# Patient Record
Sex: Female | Born: 1945 | Smoking: Never smoker
Health system: Southern US, Community
[De-identification: ages and names within clinical notes are randomized; demographics above are authoritative.]

## PROBLEM LIST (undated history)

## (undated) DIAGNOSIS — N183 Chronic kidney disease, stage 3 unspecified: Secondary | ICD-10-CM

## (undated) DIAGNOSIS — T7840XA Allergy, unspecified, initial encounter: Secondary | ICD-10-CM

## (undated) DIAGNOSIS — I10 Essential (primary) hypertension: Secondary | ICD-10-CM

## (undated) DIAGNOSIS — J449 Chronic obstructive pulmonary disease, unspecified: Secondary | ICD-10-CM

## (undated) DIAGNOSIS — F329 Major depressive disorder, single episode, unspecified: Secondary | ICD-10-CM

## (undated) DIAGNOSIS — M1990 Unspecified osteoarthritis, unspecified site: Secondary | ICD-10-CM

## (undated) DIAGNOSIS — I639 Cerebral infarction, unspecified: Secondary | ICD-10-CM

## (undated) DIAGNOSIS — F32A Depression, unspecified: Secondary | ICD-10-CM

## (undated) DIAGNOSIS — E785 Hyperlipidemia, unspecified: Secondary | ICD-10-CM

## (undated) DIAGNOSIS — K219 Gastro-esophageal reflux disease without esophagitis: Secondary | ICD-10-CM

## (undated) HISTORY — DX: Hyperlipidemia, unspecified: E78.5

## (undated) HISTORY — PX: TUBAL LIGATION: SHX77

## (undated) HISTORY — DX: Gastro-esophageal reflux disease without esophagitis: K21.9

## (undated) HISTORY — DX: Major depressive disorder, single episode, unspecified: F32.9

## (undated) HISTORY — DX: Allergy, unspecified, initial encounter: T78.40XA

## (undated) HISTORY — DX: Cerebral infarction, unspecified: I63.9

## (undated) HISTORY — DX: Essential (primary) hypertension: I10

## (undated) HISTORY — DX: Unspecified osteoarthritis, unspecified site: M19.90

## (undated) HISTORY — DX: Chronic obstructive pulmonary disease, unspecified: J44.9

## (undated) HISTORY — DX: Depression, unspecified: F32.A

---

## 2012-12-06 ENCOUNTER — Institutional Professional Consult (permissible substitution): Payer: Self-pay | Admitting: Internal Medicine

## 2012-12-08 ENCOUNTER — Institutional Professional Consult (permissible substitution): Payer: Self-pay | Admitting: Pulmonary Disease

## 2013-01-08 ENCOUNTER — Institutional Professional Consult (permissible substitution): Payer: Self-pay | Admitting: Internal Medicine

## 2013-01-09 ENCOUNTER — Encounter: Payer: Self-pay | Admitting: Internal Medicine

## 2014-02-27 DIAGNOSIS — G451 Carotid artery syndrome (hemispheric): Secondary | ICD-10-CM | POA: Insufficient documentation

## 2014-04-04 ENCOUNTER — Other Ambulatory Visit: Payer: Self-pay

## 2014-04-04 NOTE — Patient Outreach (Signed)
[facility](THN) Care Management  04/04/2014  Jeronimo GreavesGail D Widger 10-May-1945 [id number]   SUBJECTIVE:  [facility]phone [facility].  Discussed and offered [facility] care management services.  Patient verbally agreed to receive services.   Patient states she had a "mild stroke" approximately 6 weeks ago.  States she has follow up with neurologist, Dr. Adella HareApplegate.  Patient reports she received a blood pressure monitor from her insurance company recently.  States she is taking her blood pressure daily but is not recording.  Patient reports she has had several episodes of low blood pressures.  States blood pressures have been as low as 80/66 and 80/54.  Patient reports her doctor is aware and will continue to monitor.  Patient states she becomes light headed with low blood pressures.   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  States she does walk with a limp on her left side due to the arthritis. Patient denies ambulatory aids.   ASSESSMENT:  Patient will benefit from telephonic case management for hypertension and new onset stroke education.    PLAN:  RNCM will follow up with patient in 3 weeks. RNCM will [facility]. Patient will review consent form and [facility].  George InaDavina Alayzia Pavlock RN,BSN,CCM [facility] Telephonic Care Coordinator [phone]

## 2014-04-05 NOTE — Patient Outreach (Signed)
[facility](THN) Care Management  04/05/2014  Jeronimo GreavesGail D Monarch [birth year] [id number]  Outreach letter and consent packet sent to patient.   George InaDavina Gaynell Eggleton RN,BSN,CCM [facility] Telephonic Care Coordinator [phone]

## 2014-04-17 ENCOUNTER — Other Ambulatory Visit: Payer: Self-pay

## 2014-04-17 NOTE — Patient Outreach (Signed)
[facility](THN) Care Management  04/17/2014  Jeronimo GreavesGail D Baumgartner 10/19/1945 [id number]  RNCM contacted patient for information update regarding insurance carrier/identification and emergency contacts.  Patient provided information.  Patient shares that [facility] and mailed back today.   PLAN:  RNCM will contact patient at next scheduled outreach.   George InaDavina Kaimana Lurz RN,BSN,CCM [facility] Telephonic Care Coordinator [phone]

## 2014-04-22 DIAGNOSIS — I10 Essential (primary) hypertension: Secondary | ICD-10-CM

## 2014-04-22 NOTE — Patient Outreach (Signed)
[facility](THN) Care Management  04/22/2014  Kelsey GreavesGail D Copeland 1945-11-28 [id number]   ASSESSMENT;  Patient will benefit from continued disease management with health coach for hypertension/.  PLAN:  RNCM will refer patient to health coach.  Kelsey InaDavina Ladarren Steiner RN,BSN,CCM [facility] Telephonic Care Coordinator [phone]

## 2014-04-24 ENCOUNTER — Other Ambulatory Visit: Payer: Self-pay

## 2014-04-24 NOTE — Patient Outreach (Signed)
[facility](THN) Care Management  04/24/2014  Kelsey GreavesGail D Copeland [birth year] [id number]   Attempted telephone contact.  Message left requesting patient call back.   [facility] HC will attempt to call later today; if unsuccessful will call on 04-26-14.

## 2014-04-25 ENCOUNTER — Ambulatory Visit: Payer: Self-pay

## 2014-04-26 ENCOUNTER — Ambulatory Visit: Payer: Commercial Managed Care - HMO

## 2014-04-26 ENCOUNTER — Telehealth: Payer: Self-pay

## 2014-04-26 NOTE — Patient Outreach (Signed)
[facility](THN) Care Management  04/26/2014  Kelsey GreavesGail D Copeland 16-May-1945 [id number]   Telephonic contact with patient referred to [facility] with diagnosis of stroke.  Patient states she has had two "minor" strokes.  Long history of hypertension and hyperlipidemia.  Health Coach will follow monthly for disease management for the following barriers: *Needs education about low sodium diets. *Improved management of hypertension with regular monitoring.  Tyler Deisonnie Sariah Henkin, RN, MSN [facility] Health Coach [phone] Fax [phone]

## 2014-05-20 ENCOUNTER — Other Ambulatory Visit: Payer: Commercial Managed Care - HMO

## 2014-05-20 NOTE — Patient Outreach (Signed)
[facility](THN) Care Management  05/20/2014  [name] 04/14/1945 [id number]   Health Coach telephonic contact to follow up with patient regarding self-management of her hypertension and adherence to low sodium diet.  Patient had what she describes as a "small stroke" about 3 months ago.  Patient has implemented a low salt diet by using more fresh fruits and vegetables, reading labels, avoiding salty snacks, and using a decreased sodium product when she cooks.  Patient has also kept a daily record of her BP readings since our first contact on 04-26-14, and reports readings within a normal range.  Education provided today on signs/symptoms of stroke, and stroke action plan.    Will follow up within a month.  Tyler Deisonnie Alisandra Son, RN, MSN [facility] Health Coach [phone] Fax [phone]

## 2014-06-21 ENCOUNTER — Ambulatory Visit: Payer: Self-pay

## 2014-06-25 ENCOUNTER — Other Ambulatory Visit: Payer: Self-pay

## 2014-06-25 DIAGNOSIS — I639 Cerebral infarction, unspecified: Secondary | ICD-10-CM

## 2014-06-25 DIAGNOSIS — I10 Essential (primary) hypertension: Secondary | ICD-10-CM

## 2014-06-25 DIAGNOSIS — R682 Dry mouth, unspecified: Secondary | ICD-10-CM

## 2014-06-25 NOTE — Patient Outreach (Signed)
[facility]) Care Management  [facility] Care Manager  06/25/2014   Kelsey Copeland 1945-12-31 [id number]  Follow-up phone call with patient today by RN Health Coach.  Patient states she is doing well, but was unable to demonstrate knowledge of low-sodium diet or stroke plan. Also reports she has not been taking and recording daily blood pressures.  Health Coach reinforced teaching about using log furnished by [facility] to record daily blood pressures, reviewed signs and symptoms of stroke, and discussed low-sodium diet guidelines.  Patient reports new problem with dry mouth.  Instructed re increasing her fluid intake and using alcohol-free mouth wash, and to report problem to PCP on her scheduled visit next week.   Health Coach will continue monthly telephonic contacts for teaching and support.     Current Medications:  Current Outpatient Prescriptions  Medication Sig Dispense Refill  . amLODipine (NORVASC) 10 MG tablet Take 10 mg by mouth daily.    Marland Kitchen aspirin 325 MG tablet Take 325 mg by mouth daily.    Marland Kitchen atorvastatin (LIPITOR) 80 MG tablet Take 80 mg by mouth daily.    Marland Kitchen ezetimibe (ZETIA) 10 MG tablet Take 10 mg by mouth daily.    . metoprolol tartrate (LOPRESSOR) 25 MG tablet Take 25 mg by mouth 2 (two) times daily.    Marland Kitchen omeprazole (PRILOSEC) 40 MG capsule Take 40 mg by mouth daily.    . sertraline (ZOLOFT) 100 MG tablet Take 100 mg by mouth daily.     No current facility-administered medications for this visit.    Functional Status:  In your present state of health, do you have any difficulty performing the following activities: 06/25/2014 06/25/2014  Hearing? N N  Vision? N N  Difficulty concentrating or making decisions? - N  Walking or climbing stairs? - Y  Dressing or bathing? - N  Doing errands, shopping? Y N  Preparing Food and eating ? - N  Using the Toilet? - N  In the past six months, have you accidently leaked urine? - Y  Do you have problems with loss of  bowel control? - N  Managing your Medications? - N  Managing your Finances? - -  Housekeeping or managing your Housekeeping? - N    Fall/Depression Screening: PHQ 2/9 Scores 06/25/2014 04/26/2014  PHQ - 2 Score 0 0   THN CM Care Plan Problem One        Patient Outreach Telephone from 06/25/2014 in [facility]   Care Plan Problem One  Patient needs instruction and support to adher to a low sodium diet.   Care Plan for Problem One  Active   THN Long Term Goal (31-90 days)  Patient will demonstrate knowledge and adherence to a low sodium diet within 90 days.   [facility] Long Term Goal Start Date  04/26/14   Interventions for Problem One Long Term Goal  Reviewed low sodium guidellines.  Requested she review EMMI.   [facility] CM Short Term Goal #1 (0-30 days)  Patient will verbalize understanding of low sodium diet within 30 days.   [facility] CM Short Term Goal #1 Start Date  06/25/14   Interventions for Short Term Goal #1  Reinforced teaching.  Reopened this goal. Patient appeared less sure of dietary guidelines.    [facility] CM Care Plan Problem Two        Patient Outreach Telephone from 06/25/2014 in [facility]   Care Plan Problem Two  Patient will self-manage hypertension.   Care Plan  for Problem Two  Active   Interventions for Problem Two Long Term Goal   -- [Reinforced need to take and record daily BPs.]   THN Long Term Goal Start Date  04/26/14    [facility] CM Care Plan Problem Three        Patient Outreach Telephone from 06/25/2014 in [facility]   Care Plan Problem Three  Knowledge deficit about stroke plan.   [facility] CM Short Term Goal #1 (0-30 days)  -- [Patient will be able to discuss action plan for stroke withi]   Interventions for Short Term Goal #1  Reinforced education of FAST stroke plan.  Patient only able to name 2 s/s of stroke.     Tyler Deis, RN, MSN [facility] Health Coach [phone] Fax [phone]       [facility]) Care  Management  06/25/2014  Kelsey Copeland 01-27-1945 [id number]

## 2014-07-19 ENCOUNTER — Ambulatory Visit: Payer: Commercial Managed Care - HMO

## 2014-07-22 ENCOUNTER — Other Ambulatory Visit: Payer: Self-pay

## 2014-07-22 NOTE — Patient Outreach (Signed)
[facility]) Care Management  [place]  07/22/2014   Kelsey Copeland 1945/04/08 [id number]  Subjective: Patient reports she is anticipating knee replacement surgery.  She has an orthopedic consult scheduled for next month.  She reports taking her medications as ordered, following a low salt diet, and monitoring her blood pressure each day.  Blood pressure readings reported as in the range of 124/63.  Patient also reports she has decreased the amount of meat that she consumes to 1-2 servings a week, but she was unsure of other dietary sources of protein.  Objective: Alert, oriented patient.  Current Medications:  Current Outpatient Prescriptions  Medication Sig Dispense Refill  . amLODipine (NORVASC) 10 MG tablet Take 10 mg by mouth daily.    Marland Kitchen aspirin 325 MG tablet Take 325 mg by mouth daily.    Marland Kitchen atorvastatin (LIPITOR) 80 MG tablet Take 80 mg by mouth daily.    Marland Kitchen ezetimibe (ZETIA) 10 MG tablet Take 10 mg by mouth daily.    . metoprolol tartrate (LOPRESSOR) 25 MG tablet Take 25 mg by mouth 2 (two) times daily.    Marland Kitchen omeprazole (PRILOSEC) 40 MG capsule Take 40 mg by mouth daily.    . sertraline (ZOLOFT) 100 MG tablet Take 100 mg by mouth daily.     No current facility-administered medications for this visit.    Functional Status:  In your present state of health, do you have any difficulty performing the following activities: 06/25/2014 06/25/2014  Hearing? N N  Vision? N N  Difficulty concentrating or making decisions? - N  Walking or climbing stairs? - Y  Dressing or bathing? - N  Doing errands, shopping? Y N  Preparing Food and eating ? - N  Using the Toilet? - N  In the past six months, have you accidently leaked urine? - Y  Do you have problems with loss of bowel control? - N  Managing your Medications? - N  Managing your Finances? - -  Housekeeping or managing your Housekeeping? - N    Fall/Depression Screening: PHQ 2/9 Scores 07/22/2014 06/25/2014  04/26/2014  PHQ - 2 Score 0 0 0    Assessment: Patient has met goals for monitoring her blood pressure and for adopting a low sodium diet.  She  needs additional education on a Stroke Action Plan, including being able to name signs/symptoms of a stroke. She also needs to increase intake of protein foods other than meat, since she reports only 1-2 servings of meat per week.  Plan: Provide information on protein rich foods.  Patient is to review Stroke Action Plan previously provided to her so that we can discuss it next month.   Candie Mile, RN, MSN [place] [phone] Fax [phone]   [facility]) Care Management  [facility] Care Manager  07/22/2014   Kelsey Copeland [birth year] [id number]  Subjective:   Objective:   Current Medications:  Current Outpatient Prescriptions  Medication Sig Dispense Refill  . amLODipine (NORVASC) 10 MG tablet Take 10 mg by mouth daily.    Marland Kitchen aspirin 325 MG tablet Take 325 mg by mouth daily.    Marland Kitchen atorvastatin (LIPITOR) 80 MG tablet Take 80 mg by mouth daily.    Marland Kitchen ezetimibe (ZETIA) 10 MG tablet Take 10 mg by mouth daily.    . metoprolol tartrate (LOPRESSOR) 25 MG tablet Take 25 mg by mouth 2 (two) times daily.    Marland Kitchen omeprazole (PRILOSEC) 40 MG capsule Take 40 mg by  mouth daily.    . sertraline (ZOLOFT) 100 MG tablet Take 100 mg by mouth daily.     No current facility-administered medications for this visit.    Functional Status:  In your present state of health, do you have any difficulty performing the following activities: 07/22/2014 06/25/2014  Hearing? N N  Vision? N N  Difficulty concentrating or making decisions? N -  Walking or climbing stairs? Y -  Dressing or bathing? N -  Doing errands, shopping? Tempie Donning  Preparing Food and eating ? N -  Using the Toilet? N -  In the past six months, have you accidently leaked urine? Y -  Do you have problems with loss of bowel control? N -  Managing your Medications? N -   Managing your Finances? N -  Housekeeping or managing your Housekeeping? N -    Fall/Depression Screening: PHQ 2/9 Scores 07/22/2014 07/22/2014 06/25/2014 04/26/2014  PHQ - 2 Score 0 0 0 0    Assessment:   Plan:

## 2014-08-20 ENCOUNTER — Ambulatory Visit: Payer: Self-pay

## 2014-08-20 ENCOUNTER — Other Ambulatory Visit: Payer: Self-pay

## 2014-08-20 DIAGNOSIS — M1710 Unilateral primary osteoarthritis, unspecified knee: Secondary | ICD-10-CM | POA: Insufficient documentation

## 2014-08-20 NOTE — Patient Outreach (Signed)
[facility]) Care Management  [facility] Care Manager  08/20/2014   Kelsey Copeland [birth year] [id number]   Telephonic assessment conducted today.  Patient reports success with implementing a low sodium diet, and monitoring daily blood pressure readings.  Her self-reported blood pressure readings are within a normal range.  Weight currently is 160 pounds, which is a 5 pound loss since last month. She saw an orthopedist on 08-15-14, who has decided against knee replacement surgery.  She has a return appointment scheduled for a steroid injection.  She had previously reported eating meat only 1-2 times a week.  Today she was able to name 5 high protein foods that she has added into her diet to maintain a healthy protein intake.    Current Medications:  Current Outpatient Prescriptions  Medication Sig Dispense Refill  . amLODipine (NORVASC) 10 MG tablet Take 10 mg by mouth daily.    Marland Kitchen aspirin 81 MG tablet Take 81 mg by mouth daily.    Marland Kitchen atorvastatin (LIPITOR) 80 MG tablet Take 80 mg by mouth daily.    Marland Kitchen ezetimibe (ZETIA) 10 MG tablet Take 10 mg by mouth daily.    . metoprolol tartrate (LOPRESSOR) 25 MG tablet Take 25 mg by mouth 2 (two) times daily.    Marland Kitchen omeprazole (PRILOSEC) 40 MG capsule Take 40 mg by mouth daily.    Marland Kitchen oxybutynin (DITROPAN) 5 MG tablet Take 5 mg by mouth 2 (two) times daily.    . sertraline (ZOLOFT) 100 MG tablet Take 100 mg by mouth daily.    Marland Kitchen aspirin 325 MG tablet Take 325 mg by mouth daily.     No current facility-administered medications for this visit.    Functional Status:  In your present state of health, do you have any difficulty performing the following activities: 08/20/2014 07/22/2014  Hearing? N N  Vision? N N  Difficulty concentrating or making decisions? N N  Walking or climbing stairs? N Y  Dressing or bathing? N N  Doing errands, shopping? Kelsey Copeland  Preparing Food and eating ? N N  Using the Toilet? N N  In the past six months, have you  accidently leaked urine? Y Y  Do you have problems with loss of bowel control? N N  Managing your Medications? N N  Managing your Finances? N N  Housekeeping or managing your Housekeeping? N N    Fall/Depression Screening: PHQ 2/9 Scores 08/20/2014 07/22/2014 07/22/2014 06/25/2014 04/26/2014  PHQ - 2 Score 0 0 0 0 0    Assessment: Patient unable to fully discuss signs and symptoms of a stroke or articulate an action plan.  Plan: Encouraged patient to continue with the progress she has made with decreasing salt in her diet, and           implementing a daily blood pressure check.           Patient is to review the written educational materials on strokes previously provided to her before our           next telephone contact in approximately one month.  Tyler Deis, RN, MSN RN [employer] [phone] Fax [phone]

## 2014-09-23 ENCOUNTER — Other Ambulatory Visit: Payer: Self-pay

## 2014-09-23 ENCOUNTER — Ambulatory Visit: Payer: Self-pay

## 2014-09-23 DIAGNOSIS — I5042 Chronic combined systolic (congestive) and diastolic (congestive) heart failure: Secondary | ICD-10-CM

## 2014-09-23 NOTE — Patient Outreach (Addendum)
[facility]) Care Management  09/23/2014  Kelsey Copeland [birth year] [id number]   [facility]) Care Management  09/23/2014  Kelsey Copeland 28-Feb-1945 [id number]   Telephone call for scheduled assessment.  Patient reports she has been in the hospital in [place] due to a mild stroke, and was discharged about 3 weeks ago.  States she has had a nurse and PT coming out to visit, but she is unsure if they are coming back.  Patient was unable to answer assessment questions, and did not seem to be herself.  Patient lives alone, but consents signed for [facility] allow for communication with her son and daughter-in-law, who both share POA.  Telephone call placed by RN Health Coach to Kelsey Copeland, daughter-in-law and POA.  Kelsey Copeland reports concerns that patient is becoming confused and struggling to care for herself.  States she has not been bathing, has not eating a healthy diet,  forgets to take her dog out, and is having difficulty managing her finances.  Kelsey Copeland did confirm that Kelsey Copeland was admitted to the hospital in [place] on August 20th and was discharged on the 23rd.  Health Coach will refer to [facility] with request that home visit be coordinated with either her son Kelsey Copeland or daughter-in-law Kelsey Copeland.  Plan: Refer for Transition of Care for home visit assessment.          Notify PCP of patient status.          Daughter-in-law will contact PCP for appointment.  Kelsey Deis, RN, MSN RN [employer] [phone] Fax [phone]

## 2014-09-25 NOTE — Addendum Note (Signed)
Addended by: Matthew Saras on: 09/25/2014 04:24 PM   Modules accepted: Orders

## 2014-09-25 NOTE — Patient Outreach (Signed)
[facility]) Care Management  09/25/2014  Kelsey Copeland [birth year] [id number]   Referral from Tyler Deis, RN to assign Community RN, assigned Egbert Garibaldi, RN (for [name], RN).  Thanks, [name]. Sharlee Blew [facility] Care Management [facility] CM Assistant Phone: [phone] Fax: [phone]

## 2014-09-27 ENCOUNTER — Other Ambulatory Visit: Payer: Self-pay | Admitting: *Deleted

## 2014-09-27 NOTE — Patient Outreach (Signed)
[facility]) Care Management  09/27/2014  DILYNN MUNROE [birth year] [id number]   Telephone outreach call to Mrs.Abreu's home, no answer,phone not in service.Due to concern noted by Health coach on 9/19, I placed a called to her daughter in law, S[place]hire T[place]art [place], she reports that Mrs.Mossberg's cell phone is off and will not being turned by on until Thursday and that would be the best time to call her. Dois Davenport states that Mrs.Bagshaw had a post hospital office visit on this week at Dr. Adah Perl states that Mrs.Pickerel was discharged from [facility] on August 23, she had home health physical therapy for a while but those services have been completed. Patient lives alone in a apartment and uses a walker for ambulation . Dois Davenport states that she provides transportation for Mrs. Willden and assist with grocery shopping and Mrs. Aller manages her own medication. Dois Davenport voiced concern regarding Mrs Manrique being forgetful.  Plan: Care management coordinator  will follow up with Mrs. Beem next to assess needs.  Egbert Garibaldi, RN, [facility] Care Management [phone]

## 2014-09-30 NOTE — Patient Outreach (Signed)
Documentation encounter: New referral received by [facility] last week ( during my absence) Ander Purpura RN called patient and health care POA. It was suggested to call patient on 10/03/2014. I reviewed EPIC and [facility] Electronic Medical records.  Patient is not able to be located in [facility] medical records. ( Patient is not found).  PLAN: Will perform outreach attempt on 10/03/2014 as requested by health care power of attorney.   [name], RN, BSN, CEN [facility] [employer] [phone]

## 2014-10-03 ENCOUNTER — Other Ambulatory Visit: Payer: Self-pay

## 2014-10-03 NOTE — Patient Outreach (Signed)
Care Coordination: Placed call to patient who identified herself. Patient reports that she is struggling after having her stroke. Would like an in home assessment.  Offered appointment for Monday 10/07/2014. Patient accepted. Confirmed address.  PLAN: Kelsey Copeland see patient for an initial home visit on 10/07/2014.  [name], RN, BSN, CEN [facility] [employer] [phone]

## 2014-10-07 ENCOUNTER — Other Ambulatory Visit: Payer: Self-pay

## 2014-10-07 NOTE — Patient Outreach (Signed)
[facility]) Care Management  10/07/2014  Kelsey Copeland March 29, 1945 [id number] Initial Home Visit: Patient with stroke like symptoms:  11:00 AM  Arrived for scheduled home visit. Patient ambulated to the door however it took her a few minutes to answer the door. I reminded patient the purpose of my visit and patient just stared at me. She was having difficulty talking and processing her thoughts.  I ask her how long she had these symptoms and she reported 2-2.5 hours. I asked patient if I could call son and she said yes. She was unable to verbalize phone number but handed me a book with his number.  11:07am  Placed call to son to explain patients condition. Son reports that this is not normal for patient.  I explained my concerns of patients symptoms of a stroke.  I suggested that we call 911 and son agreed. Patient with slurred speech, right facial drooping and unable to process thoughts.  11:09 am Placed call to EMS. 11:12 Spoke with daughter in law and she reports that patient did not seem like herself this morning on the phone.  Daughter in law ask me to put dog in bedroom and lock the door behind Kelsey Copeland.  I assisted with gathering medications and important papers for patient while EMS was assessing. 11:18 am  EMS arrived 11:25am  Patient en route to [facility]. I informed EMS I would notify family. 11:30 am Attempted to reach son and daughter in law. Unsuccessful. Left a message requesting a call back. 11:40 am  Spoke with daughter in law and provided update of where patient was being transported. 11:59 am  Received call from attending ED physician named Dr. Bernette Mayers at [phone].  Inquiring about history and symptoms. I provided son and daughters contact number and described my finding when I arrived for home visit.  PLAN: Will follow up with daughter in law and son in the next 2 days. Will reschedule home visit when patient is ready.  Spoke with Melissa at Dr.  Lamar Sprinkles office and informed her of events   Please note initial home visit not completed due patients acute symptoms.  [name], RN, BSN, CEN [facility] [employer] [phone] .

## 2014-10-10 ENCOUNTER — Other Ambulatory Visit: Payer: Self-pay

## 2014-10-10 NOTE — Patient Outreach (Signed)
Care Coordination.  Follow up for acute stroke.  Placed call to daughter in law Dois Davenport. She reports that patient remains inpatient at [facility] with acute stroke. Family voiced that patient is unable to speak. Reports that they filled out paperwork for medicaid and are working with social worker to determine rehab and placement options. At this time, family does not yet have a solid plan. Encouraged daughter in law and son to speak openly with social worker at the hospital about concerns they have with patient returning home related to safety concerns, inability for patient to care for herself including not taking meds and not eating well.   Plan: Discussed with daughter in law Dois Davenport that I would call her in a few weeks to get a update of plan. Provided emotional support and my contact number during telephone call.  [name], RN, BSN, CEN [facility] [phone]

## 2014-10-22 ENCOUNTER — Other Ambulatory Visit: Payer: Self-pay

## 2014-10-22 NOTE — Patient Outreach (Signed)
Phone call from daughter in law: Daughter in law called and states that patient is no longer able to care for herself at home. Reports that she has spoken with social worker at [facility].  Reports that she was told that patient needed another discharge plan, Daughter in law reports that she and her husband are unable to care for patient and she woul d need placement. I encouraged daughter in law to work with hospital staff / [facility]istsocial worker [facility] to get [facility]. Daughter in law informs he that they have already cleaned out her apartment and provided a notice so patient would not have rent for next month and her check could go to whatever facility patient is placed in.  Plan: Daughter in law will continue to call me with update. I provided a listening ear and helpful suggestions during this call top help daughter in law process crisis.  Rowe PavyAmanda Cook, RN, BSN, CEN [facility] [employer] Care Coordinator [phone]

## 2014-10-29 ENCOUNTER — Other Ambulatory Visit: Payer: Self-pay

## 2014-10-29 NOTE — Patient Outreach (Signed)
Care Coordination: Follow up call made to son and daughter in law from last week. Daughter in law reports that patient was discharged from [facility]. Daughter in law reports that reviews from facility were not good and that family decided to take patient home with them. ( Son and daughter in law) on 10/24/2014 Daughter in law reports that patient is doing well at their home. Reports she does not need [facility] assistance at this time. Reports that patient saw primary MD today and will have speech therapy and will be getting a wheelchair. Daughter in law reports that she is managing medications, meals and all needs without difficulty.  I offered to come to do a home visit and daughter in law declined. Reports that she will call me if patient has in needs in the future.   I will send a case closure letter to MD and will notify Nena PolioLisa Moore of case closure.  Rowe PavyAmanda Dawanna Grauberger, RN, BSN, CEN [facility] [employer] Care Coordinator [phone]

## 2014-11-01 NOTE — Patient Outreach (Signed)
[facility]) Care Management  11/01/2014  Jeronimo GreavesGail D Donlan [birth year] [id number]   Notification from Rowe PavyAmanda Cook, RN patient and caregivers wished to withdraw from [facility] services.  Thanks, Corrie MckusickLisa O. Sharlee BlewMoore, AABA [facility] Care Management [facility] CM Assistant Phone: [phone] Fax: [phone]

## 2014-12-31 ENCOUNTER — Inpatient Hospital Stay (HOSPITAL_COMMUNITY)
Admission: EM | Admit: 2014-12-31 | Discharge: 2015-01-03 | DRG: 041 | Disposition: A | Discharge status: Skilled Nursing Facility | Payer: Commercial Managed Care - HMO | Attending: Internal Medicine | Admitting: Internal Medicine

## 2014-12-31 ENCOUNTER — Emergency Department (HOSPITAL_COMMUNITY): Payer: Commercial Managed Care - HMO

## 2014-12-31 ENCOUNTER — Encounter (HOSPITAL_COMMUNITY): Payer: Self-pay

## 2014-12-31 DIAGNOSIS — I63432 Cerebral infarction due to embolism of left posterior cerebral artery: Secondary | ICD-10-CM | POA: Diagnosis not present

## 2014-12-31 DIAGNOSIS — I129 Hypertensive chronic kidney disease with stage 1 through stage 4 chronic kidney disease, or unspecified chronic kidney disease: Secondary | ICD-10-CM | POA: Diagnosis present

## 2014-12-31 DIAGNOSIS — R609 Edema, unspecified: Secondary | ICD-10-CM

## 2014-12-31 DIAGNOSIS — I638 Other cerebral infarction: Secondary | ICD-10-CM | POA: Diagnosis not present

## 2014-12-31 DIAGNOSIS — K219 Gastro-esophageal reflux disease without esophagitis: Secondary | ICD-10-CM | POA: Diagnosis present

## 2014-12-31 DIAGNOSIS — G40909 Epilepsy, unspecified, not intractable, without status epilepticus: Secondary | ICD-10-CM | POA: Diagnosis present

## 2014-12-31 DIAGNOSIS — G9340 Encephalopathy, unspecified: Secondary | ICD-10-CM | POA: Diagnosis not present

## 2014-12-31 DIAGNOSIS — I639 Cerebral infarction, unspecified: Secondary | ICD-10-CM | POA: Diagnosis present

## 2014-12-31 DIAGNOSIS — M1710 Unilateral primary osteoarthritis, unspecified knee: Secondary | ICD-10-CM | POA: Diagnosis present

## 2014-12-31 DIAGNOSIS — Z7982 Long term (current) use of aspirin: Secondary | ICD-10-CM | POA: Diagnosis not present

## 2014-12-31 DIAGNOSIS — F329 Major depressive disorder, single episode, unspecified: Secondary | ICD-10-CM | POA: Diagnosis present

## 2014-12-31 DIAGNOSIS — M1990 Unspecified osteoarthritis, unspecified site: Secondary | ICD-10-CM | POA: Diagnosis present

## 2014-12-31 DIAGNOSIS — R4781 Slurred speech: Secondary | ICD-10-CM | POA: Diagnosis present

## 2014-12-31 DIAGNOSIS — N183 Chronic kidney disease, stage 3 unspecified: Secondary | ICD-10-CM

## 2014-12-31 DIAGNOSIS — I69351 Hemiplegia and hemiparesis following cerebral infarction affecting right dominant side: Secondary | ICD-10-CM | POA: Diagnosis not present

## 2014-12-31 DIAGNOSIS — R29810 Facial weakness: Secondary | ICD-10-CM | POA: Diagnosis present

## 2014-12-31 DIAGNOSIS — J449 Chronic obstructive pulmonary disease, unspecified: Secondary | ICD-10-CM | POA: Insufficient documentation

## 2014-12-31 DIAGNOSIS — I10 Essential (primary) hypertension: Secondary | ICD-10-CM | POA: Diagnosis present

## 2014-12-31 DIAGNOSIS — G510 Bell's palsy: Secondary | ICD-10-CM

## 2014-12-31 DIAGNOSIS — R4701 Aphasia: Secondary | ICD-10-CM | POA: Diagnosis present

## 2014-12-31 DIAGNOSIS — R569 Unspecified convulsions: Secondary | ICD-10-CM | POA: Insufficient documentation

## 2014-12-31 DIAGNOSIS — E785 Hyperlipidemia, unspecified: Secondary | ICD-10-CM | POA: Diagnosis present

## 2014-12-31 HISTORY — DX: Chronic kidney disease, stage 3 unspecified: N18.30

## 2014-12-31 HISTORY — DX: Chronic kidney disease, stage 3 (moderate): N18.3

## 2014-12-31 LAB — DIFFERENTIAL
BASOS ABS: 0 10*3/uL (ref 0.0–0.1)
BASOS PCT: 0 %
Eosinophils Absolute: 0.1 10*3/uL (ref 0.0–0.7)
Eosinophils Relative: 2 %
LYMPHS PCT: 17 %
Lymphs Abs: 1.3 10*3/uL (ref 0.7–4.0)
Monocytes Absolute: 0.4 10*3/uL (ref 0.1–1.0)
Monocytes Relative: 6 %
NEUTROS ABS: 5.5 10*3/uL (ref 1.7–7.7)
NEUTROS PCT: 75 %

## 2014-12-31 LAB — I-STAT CHEM 8, ED
BUN: 14 mg/dL (ref 6–20)
CALCIUM ION: 1.13 mmol/L (ref 1.13–1.30)
CHLORIDE: 111 mmol/L (ref 101–111)
Creatinine, Ser: 1.2 mg/dL — ABNORMAL HIGH (ref 0.44–1.00)
Glucose, Bld: 95 mg/dL (ref 65–99)
HEMATOCRIT: 34 % — AB (ref 36.0–46.0)
Hemoglobin: 11.6 g/dL — ABNORMAL LOW (ref 12.0–15.0)
POTASSIUM: 3.8 mmol/L (ref 3.5–5.1)
SODIUM: 142 mmol/L (ref 135–145)
TCO2: 18 mmol/L (ref 0–100)

## 2014-12-31 LAB — COMPREHENSIVE METABOLIC PANEL
ALBUMIN: 3.5 g/dL (ref 3.5–5.0)
ALK PHOS: 81 U/L (ref 38–126)
ALT: 21 U/L (ref 14–54)
AST: 30 U/L (ref 15–41)
Anion gap: 12 (ref 5–15)
BUN: 13 mg/dL (ref 6–20)
CALCIUM: 9.4 mg/dL (ref 8.9–10.3)
CHLORIDE: 110 mmol/L (ref 101–111)
CO2: 20 mmol/L — AB (ref 22–32)
CREATININE: 1.46 mg/dL — AB (ref 0.44–1.00)
GFR calc non Af Amer: 36 mL/min — ABNORMAL LOW (ref >60)
GFR, EST AFRICAN AMERICAN: 41 mL/min — AB (ref >60)
GLUCOSE: 100 mg/dL — AB (ref 65–99)
Potassium: 3.9 mmol/L (ref 3.5–5.1)
SODIUM: 142 mmol/L (ref 135–145)
Total Bilirubin: 0.6 mg/dL (ref 0.3–1.2)
Total Protein: 6.4 g/dL — ABNORMAL LOW (ref 6.5–8.1)

## 2014-12-31 LAB — CBC
HCT: 33.4 % — ABNORMAL LOW (ref 36.0–46.0)
Hemoglobin: 11.1 g/dL — ABNORMAL LOW (ref 12.0–15.0)
MCH: 29.2 pg (ref 26.0–34.0)
MCHC: 33.2 g/dL (ref 30.0–36.0)
MCV: 87.9 fL (ref 78.0–100.0)
PLATELETS: 219 10*3/uL (ref 150–400)
RBC: 3.8 MIL/uL — AB (ref 3.87–5.11)
RDW: 15.1 % (ref 11.5–15.5)
WBC: 7.3 10*3/uL (ref 4.0–10.5)

## 2014-12-31 LAB — PROTIME-INR
INR: 1.06 (ref 0.00–1.49)
PROTHROMBIN TIME: 14 s (ref 11.6–15.2)

## 2014-12-31 LAB — I-STAT TROPONIN, ED: Troponin i, poc: 0.01 ng/mL (ref 0.00–0.08)

## 2014-12-31 LAB — ETHANOL

## 2014-12-31 LAB — APTT: APTT: 26 s (ref 24–37)

## 2014-12-31 MED ORDER — SODIUM CHLORIDE 0.9 % IV SOLN
INTRAVENOUS | Status: DC
  Administered 2015-01-01: 1000 mL via INTRAVENOUS
  Administered 2015-01-01: 02:00:00 via INTRAVENOUS

## 2014-12-31 MED ORDER — TRAZODONE HCL 50 MG PO TABS
50.0000 mg | ORAL_TABLET | Freq: Every day | ORAL | Status: DC
  Administered 2015-01-01 – 2015-01-02 (×2): 50 mg via ORAL
  Filled 2014-12-31 (×3): qty 1

## 2014-12-31 MED ORDER — ATORVASTATIN CALCIUM 80 MG PO TABS
80.0000 mg | ORAL_TABLET | Freq: Every day | ORAL | Status: DC
  Administered 2015-01-01 – 2015-01-03 (×3): 80 mg via ORAL
  Filled 2014-12-31 (×4): qty 1

## 2014-12-31 MED ORDER — AMLODIPINE BESYLATE 10 MG PO TABS
10.0000 mg | ORAL_TABLET | Freq: Every day | ORAL | Status: DC
  Administered 2015-01-01 – 2015-01-02 (×2): 10 mg via ORAL
  Filled 2014-12-31 (×3): qty 1

## 2014-12-31 MED ORDER — METOPROLOL TARTRATE 25 MG PO TABS: 25.0000 mg | ORAL_TABLET | Freq: Every day | ORAL | Status: DC

## 2014-12-31 MED ORDER — PANTOPRAZOLE SODIUM 40 MG PO TBEC
40.0000 mg | DELAYED_RELEASE_TABLET | Freq: Every day | ORAL | Status: DC
  Administered 2015-01-01 – 2015-01-02 (×2): 40 mg via ORAL
  Filled 2014-12-31 (×3): qty 1

## 2014-12-31 MED ORDER — ASPIRIN 325 MG PO TABS
325.0000 mg | ORAL_TABLET | Freq: Every day | ORAL | Status: DC
  Administered 2015-01-01 – 2015-01-02 (×2): 325 mg via ORAL
  Filled 2014-12-31 (×3): qty 1

## 2014-12-31 MED ORDER — EZETIMIBE 10 MG PO TABS
10.0000 mg | ORAL_TABLET | Freq: Every day | ORAL | Status: DC
  Administered 2015-01-01 – 2015-01-02 (×2): 10 mg via ORAL
  Filled 2014-12-31 (×3): qty 1

## 2014-12-31 MED ORDER — SERTRALINE HCL 100 MG PO TABS
200.0000 mg | ORAL_TABLET | Freq: Every day | ORAL | Status: DC
  Administered 2015-01-01 – 2015-01-02 (×2): 200 mg via ORAL
  Filled 2014-12-31 (×4): qty 2

## 2014-12-31 MED ORDER — FOLIC ACID 1 MG PO TABS
1.0000 mg | ORAL_TABLET | Freq: Every day | ORAL | Status: DC
  Administered 2015-01-01 – 2015-01-02 (×2): 1 mg via ORAL
  Filled 2014-12-31 (×3): qty 1

## 2014-12-31 NOTE — ED Notes (Signed)
[facility] [place] [facility], pt felt like she had trouble speaking, but family did not notice at the time, since [id number] family has noticed increase trouble walking and confusion, aphasia, and slurred speech. During transport slurred speech has resolved, but aphasia remains. No unilateral deficits. Two priors strokes in past six months with prior R handed weakness.

## 2014-12-31 NOTE — ED Provider Notes (Signed)
The patient is a [age] year old female, she [facility], they report that she was last seen normal at a 5:00, they're unsure of these exact numbers, we have been unable to get ahold of family members thus far. The patient states that she has some difficulty speaking, clearly she is aphasic on exam. She has a right-sided facial droop, dysmetria with the right upper extremity and has slight weakness of the right arm and right leg compared to the left side. She has no carotid bruits, abdomen is soft and nontender, no peripheral edema, no murmurs of the heart, clear lungs. CT scan of the head has been ordered and is negative, labs pending, neurology consult pending, we have activated code stroke based on this estimated 5:00 last seen normal.   EKG Interpretation  Date/Time:  Tuesday December 31 2014 21:50:09 EST Ventricular Rate:  87 PR Interval:  138 QRS Duration: 79 QT Interval:  376 QTC Calculation: 452 R Axis:   27 Text Interpretation:  Sinus rhythm Borderline low voltage, extremity leads No old tracing to compare Confirmed by Rogerio Boutelle  MD, Jolan Upchurch ([id number]) on 12/31/2014 10:16:41 PM       Medical screening examination/treatment/procedure(s) were conducted as a shared visit with non-physician practitioner(s) and myself.  I personally evaluated the patient during the encounter.  Clinical Impression:   Final diagnoses:  Acute ischemic stroke [facility](HCC)         Eber HongBrian Robertlee Rogacki, MD 01/04/15 2208

## 2014-12-31 NOTE — ED Notes (Signed)
Admitting at bedside 

## 2014-12-31 NOTE — H&P (Signed)
[facility] History and Physical  Kelsey Copeland ZOX:[id number] DOB: [birth year] DOA: 12/31/2014  Referring physician: ED physician PCP: Abigail Miyamoto, MD  Specialists:   Chief Complaint: worsening aphasia and confusion.  HPI: Kelsey Copeland is a 69 y.o. female with PMH of hypertension, hyperlipidemia, stroke, arthritis, chronic kidney disease-stage III, GERD, depression, who presents with worsening aphasia and confusion.  Per patient's daughter-in-law, patient seems to have worsening slurred speech and confusion started on 17:00 PM. She also has difficult walking with R leg weaker than the left. Her daughter-in law found her on the ground using her left arm to "pick at" things that were around her. She is not sure about dystonic posturing of the right arm as she was standing behind her and did not take notice of it. She had a similar episode of picking at things and not speaking yesterday, but did not have the same confusion afterwards that she has today. Patient does not have vision change or hearing loss. No chest pain, shortness of breath, abdominal pain, diarrhea, symptoms of UTI.  In ED, patient was found to have INR 1.06, RBC 7.3, temperature 98.3, no tachycardia, stable renal function. Negative CT head for acute abnormalities. Patient admitted to inpatient for further eval and treatment. Neurology was consulted.  Where does patient live?   At home   Can patient participate in ADLs? Barely  Review of Systems:   General: no fevers, chills, no changes in body weight, has fatigue HEENT: no blurry vision, hearing changes or sore throat Pulm: no dyspnea, coughing, wheezing CV: no chest pain, palpitations Abd: no nausea, vomiting, abdominal pain, diarrhea, constipation GU: no dysuria, burning on urination, increased urinary frequency, hematuria  Ext: no leg edema Neuro: has leg weakness, no vision change or hearing loss. Has R facial droop. Skin: no rash MSK: No muscle  spasm, no deformity, no limitation of range of movement in spin Heme: No easy bruising.  Travel history: No recent long distant travel.  Allergy:  Allergies  Allergen Reactions  . Plavix [Clopidogrel Bisulfate] Anaphylaxis    Bleeding also  . Amoxicillin Other (See Comments)  . Ciprofloxacin Other (See Comments)    Causes muscle aches.  . Dipyrone Other (See Comments)    headaches  . Acyclovir And Related Other (See Comments)  . Celebrex [Celecoxib] Other (See Comments)  . Penicillins Rash    Past Medical History  Diagnosis Date  . Allergy   . Depression   . GERD (gastroesophageal reflux disease)   . Hyperlipidemia   . Hypertension   . Stroke (HCC)   . Arthritis   . COPD (chronic obstructive pulmonary disease) (HCC)   . CKD (chronic kidney disease), stage III     Past Surgical History  Procedure Laterality Date  . Tubal ligation      Social History:  reports that she has never smoked. She does not have any smokeless tobacco history on file. She reports that she does not drink alcohol or use illicit drugs.  Family History: History reviewed. No pertinent family history.   Prior to Admission medications   Medication Sig Start Date End Date Taking? Authorizing Provider  amLODipine (NORVASC) 10 MG tablet Take 10 mg by mouth daily.   Yes Historical Provider, MD  aspirin 325 MG tablet Take 325 mg by mouth daily.   Yes Historical Provider, MD  atorvastatin (LIPITOR) 80 MG tablet Take 80 mg by mouth daily.   Yes Historical Provider, MD  ezetimibe (ZETIA) 10 MG tablet Take 10  mg by mouth daily.   Yes Historical Provider, MD  folic acid (FOLVITE) 1 MG tablet Take 1 mg by mouth daily.   Yes Historical Provider, MD  metoprolol tartrate (LOPRESSOR) 25 MG tablet Take 25 mg by mouth daily.    Yes Historical Provider, MD  omeprazole (PRILOSEC) 40 MG capsule Take 40 mg by mouth daily.   Yes Historical Provider, MD  sertraline (ZOLOFT) 100 MG tablet Take 200 mg by mouth daily.    Yes  Historical Provider, MD  traZODone (DESYREL) 50 MG tablet Take 50 mg by mouth at bedtime.   Yes Historical Provider, MD    Physical Exam: Filed Vitals:   01/01/15 0000 01/01/15 0143 01/01/15 0200 01/01/15 0400  BP: 106/66 142/81 137/68 108/66  Pulse: 90 91 92 89  Temp:  97.9 F (36.6 C) 97.8 F (36.6 C) 97.8 F (36.6 C)  TempSrc:  Oral Oral Oral  Resp: 20  22 20   Height:  5' (1.524 m)    Weight:  68.221 kg (150 lb 6.4 oz)    SpO2: 93% 98% 94% 98%   General: Not in acute distress HEENT:       Eyes: PERRL, EOMI, no scleral icterus.       ENT: No discharge from the ears and nose, no pharynx injection, no tonsillar enlargement.        Neck: No JVD, no bruit, no mass felt. Heme: No neck lymph node enlargement. Cardiac: S1/S2, RRR, No murmurs, No gallops or rubs. Pulm:  No rales, wheezing, rhonchi or rubs. Abd: Soft, nondistended, nontender, no rebound pain, no organomegaly, BS present. Ext: No pitting leg edema bilaterally. 2+DP/PT pulse bilaterally. Musculoskeletal: No joint deformities, No joint redness or warmth, no limitation of ROM in spin. Skin: No rashes.  Neuro: Alert, oriented X3, cranial nerves II-XII grossly intact except for slurred speech and R facial droop, muscle strength 2/5 in both legs and 5/5 in both arms. Sensation to light touch intact.  Knee reflex 1+ bilaterally. Negative Babinski's sign Psych: Patient is not psychotic, no suicidal or hemocidal ideation.  Labs on Admission:  Basic Metabolic Panel:  Recent Labs Lab 12/31/14 2205 12/31/14 2212  NA 142 142  K 3.8 3.9  CL 111 110  CO2  --  20*  GLUCOSE 95 100*  BUN 14 13  CREATININE 1.20* 1.46*  CALCIUM  --  9.4   Liver Function Tests:  Recent Labs Lab 12/31/14 2212  AST 30  ALT 21  ALKPHOS 81  BILITOT 0.6  PROT 6.4*  ALBUMIN 3.5   No results for input(s): LIPASE, AMYLASE in the last 168 hours. No results for input(s): AMMONIA in the last 168 hours. CBC:  Recent Labs Lab 12/31/14 2205  12/31/14 2212  WBC  --  7.3  NEUTROABS  --  5.5  HGB 11.6* 11.1*  HCT 34.0* 33.4*  MCV  --  87.9  PLT  --  219   Cardiac Enzymes: No results for input(s): CKTOTAL, CKMB, CKMBINDEX, TROPONINI in the last 168 hours.  BNP (last 3 results) No results for input(s): BNP in the last 8760 hours.  ProBNP (last 3 results) No results for input(s): PROBNP in the last 8760 hours.  CBG: No results for input(s): GLUCAP in the last 168 hours.  Radiological Exams on Admission: Ct Head Wo Contrast  12/31/2014  CLINICAL DATA:  69 year old female with difficulty speaking and walking presenting with confusion and slurred speech. EXAM: CT HEAD WITHOUT CONTRAST TECHNIQUE: Contiguous axial images were obtained from  the base of the skull through the vertex without intravenous contrast. COMPARISON:  None. FINDINGS: There is dilatation of the ventricles out of proportion with the sulci which may represent central volume loss versus normal pressure hydrocephalus. Clinical correlation is recommended. Periventricular and deep white matter hypodensities represent chronic microvascular ischemic changes. Small left frontal lobe cortical/subcortical old-appearing infarct and encephalomalacia. There is no intracranial hemorrhage. No mass effect or midline shift identified. The visualized paranasal sinuses and mastoid air cells are well aerated. The calvarium is intact. IMPRESSION: No acute intracranial hemorrhage. 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 If symptoms persist and there are no contraindications, MRI may provide better evaluation if clinically indicated. These results were called by telephone at the time of interpretation on 12/31/2014 at 10:40 pm to Dr. Eber Hong , who verbally acknowledged these results. Electronically Signed   By: Elgie Collard M.D.   On: 12/31/2014 22:47   Mr Brain Wo Contrast  01/01/2015  CLINICAL DATA:  Aphasia. Evaluate  stroke. History of chronic kidney disease, hyperlipidemia and hypertension. EXAM: MRI HEAD WITHOUT CONTRAST MRA HEAD WITHOUT CONTRAST TECHNIQUE: Multiplanar, multiecho pulse sequences of the brain and surrounding structures were obtained without intravenous contrast. Angiographic images of the head were obtained using MRA technique without contrast. COMPARISON:  CT head December 31, 2014 FINDINGS: MRI HEAD FINDINGS Multiple sequences are mildly or moderately motion degraded. Subcentimeter focus of reduced diffusion LEFT occipital lobe cortex. Due to small size, limited assessment on ADC map. Moderate ventriculomegaly on the basis of global parenchymal brain volume loss. Small area LEFT frontal encephalomalacia. Patchy supratentorial and confluent pontine white matter T2 hyperintensities without midline shift, mass effect or mass lesions. No abnormal extra-axial fluid collections. Dural calcifications along anterior falx. Ocular globes and orbital contents are unremarkable though not tailored for evaluation. No abnormal sellar expansion. No suspicious calvarial bone marrow signal. Craniocervical junction maintained. Visualized paranasal sinuses and mastoid air cells are well-aerated. MRA HEAD FINDINGS Moderately motion degraded examination. Anterior circulation: Normal flow related enhancement of the included cervical, petrous, cavernous and supraclinoid internal carotid arteries. Patent anterior communicating artery. Normal flow related enhancement of the anterior and middle cerebral arteries, including distal segments. LEFT A1 segment is developmentally diminutive. Supernumerary anterior cerebral artery arising from from A-comm artery. No large vessel occlusion, high-grade stenosis, definite aneurysm. Posterior circulation: Codominant vertebral arteries. Basilar artery is patent, with normal flow related enhancement of the main branch vessels. Normal flow related enhancement of the posterior cerebral arteries.  Fetal origin LEFT posterior cerebral artery. No large vessel occlusion, high-grade stenosis, definite aneurysm. IMPRESSION: MRI HEAD: Motion degraded examination. Subcentimeter focus of probable acute ischemia LEFT occipital lobe. Moderate to severe chronic small vessel ischemic disease. Moderate global brain atrophy. LEFT frontal lobe encephalomalacia may be posttraumatic or ischemic. MRA HEAD: Moderately motion degraded examination without acute large vessel occlusion or definite high-grade stenosis. Electronically Signed   By: Awilda Metro M.D.   On: 01/01/2015 01:55   Mr Maxine Glenn Head/brain Wo Cm  01/01/2015  CLINICAL DATA:  Aphasia. Evaluate stroke. History of chronic kidney disease, hyperlipidemia and hypertension. EXAM: MRI HEAD WITHOUT CONTRAST MRA HEAD WITHOUT CONTRAST TECHNIQUE: Multiplanar, multiecho pulse sequences of the brain and surrounding structures were obtained without intravenous contrast. Angiographic images of the head were obtained using MRA technique without contrast. COMPARISON:  CT head December 31, 2014 FINDINGS: MRI HEAD FINDINGS Multiple sequences are mildly or moderately motion degraded. Subcentimeter focus of reduced diffusion LEFT occipital lobe cortex. Due to  small size, limited assessment on ADC map. Moderate ventriculomegaly on the basis of global parenchymal brain volume loss. Small area LEFT frontal encephalomalacia. Patchy supratentorial and confluent pontine white matter T2 hyperintensities without midline shift, mass effect or mass lesions. No abnormal extra-axial fluid collections. Dural calcifications along anterior falx. Ocular globes and orbital contents are unremarkable though not tailored for evaluation. No abnormal sellar expansion. No suspicious calvarial bone marrow signal. Craniocervical junction maintained. Visualized paranasal sinuses and mastoid air cells are well-aerated. MRA HEAD FINDINGS Moderately motion degraded examination. Anterior circulation: Normal  flow related enhancement of the included cervical, petrous, cavernous and supraclinoid internal carotid arteries. Patent anterior communicating artery. Normal flow related enhancement of the anterior and middle cerebral arteries, including distal segments. LEFT A1 segment is developmentally diminutive. Supernumerary anterior cerebral artery arising from from A-comm artery. No large vessel occlusion, high-grade stenosis, definite aneurysm. Posterior circulation: Codominant vertebral arteries. Basilar artery is patent, with normal flow related enhancement of the main branch vessels. Normal flow related enhancement of the posterior cerebral arteries. Fetal origin LEFT posterior cerebral artery. No large vessel occlusion, high-grade stenosis, definite aneurysm. IMPRESSION: MRI HEAD: Motion degraded examination. Subcentimeter focus of probable acute ischemia LEFT occipital lobe. Moderate to severe chronic small vessel ischemic disease. Moderate global brain atrophy. LEFT frontal lobe encephalomalacia may be posttraumatic or ischemic. MRA HEAD: Moderately motion degraded examination without acute large vessel occlusion or definite high-grade stenosis. Electronically Signed   By: Awilda Metroourtnay  Bloomer M.D.   On: 01/01/2015 01:55    EKG: Independently reviewed. QTC 452, nonspecific T-wave change Assessment/Plan Principal Problem:   Acute encephalopathy Active Problems:   CVA (cerebral vascular accident) (HCC)   Essential hypertension   Arthritis of knee   HLD (hyperlipidemia)   GERD (gastroesophageal reflux disease)   CKD (chronic kidney disease), stage III   Stroke (cerebrum) (HCC)  Acute encephalopathy: Patient has worsening symptoms of stroke. Neurology was consulted. Dr. Amada JupiterKirkpatrick evaluated the patient and is concerned that pt may have had a partial seizure, -will admit to tele bed -Appreciated neurologists consultations, we follow-up recommendations as follows 1) MRI/MRA head 2) EEG 3) would not  start antiepileptics at this time unless further episodes were to occur 4) continue aspirin, Lipitor -NPO until SLP done  HTN: -Amlodipine, metoprolol,  Stroke: -f/u MRI-MRA -on ASA  HLD: Last LDL was not on record -Continue home medications: Lipitor and Zetia -Check FLP  GERD: -Protonix  CKD-III: stable. Previous creatinine was 1.46 on 10/17/14. Her creatinine is 1.46 on admission. -Follow-up renal function by bmp  Depression: Stable, no suicidal or homicidal ideations. -Continue home medications: Zoloft  DVT ppx: SQ Heparin   Code Status: Full code Family Communication: Yes, patient's  daughter in law at bed side Disposition Plan: Admit to inpatient   Date of Service 01/01/2015    Lorretta HarpIU, Staci Dack [facility] Pager [phone]  If 7PM-7AM, please contact night-coverage [email or web address] Password [email or web address] 01/01/2015, 5:59 AM

## 2014-12-31 NOTE — Consult Note (Signed)
Neurology Consultation Reason for Consult: Aphasia, weakness.  Referring Physician: Hyacinth MeekerMiller, B  CC: aphasia  History is obtained from:patient  HPI: Kelsey GreavesGail D Copeland is a 69 y.o. female with a history of recent strokes who presents with worsening aphasia and confusion. She initially presented to [facility] for her second stroke in October which presented similarly to the events today where she was found to have a subacute infarct. Given that her symptoms started presumably after the stroke, there was concern possible seizure was involved and she had an EEG which was negative.  Tonight, her daughter found her on the ground using her left arm to "pick at" things that were around her. She is not sure about dystonic posturing of the right arm as she was standing behind her and did not take notice of it. She did not speak while she was picking at things, but subsequently was acting confused such as coming out of the bathroom without getting dressed. She was also slurring her speech and having significant difficulty with word finding which was something that was present immediately after her most recent stroke, but had improved greatly.  She had a similar episode of picking at things and not speaking yesterday, but did not have the same confusion afterwards that she has today.  Also of note, she has not been eating or drinking well recently.  In the ambulance, she did have some improvement but is still having significant slurred speech and word finding difficulty that was not present earlier today.  ROS: A 14 point ROS was performed and is negative except as noted in the HPI.   Past Medical History  Diagnosis Date  . Allergy   . Depression   . GERD (gastroesophageal reflux disease)   . Hyperlipidemia   . Hypertension   . Stroke (HCC)   . Arthritis   . COPD (chronic obstructive pulmonary disease) (HCC)      Family history: No history of stroke   Social History:  reports that she does not  drink alcohol. Her tobacco and drug histories are not on file.   Exam: Current vital signs: BP 157/109 mmHg  Pulse 88  Temp(Src) 98.3 F (36.8 C) (Oral)  Resp 17  Ht 5\' 6"  (1.676 m)  Wt 72.576 kg (160 lb)  BMI 25.84 kg/m2  SpO2 96% Vital signs in last 24 hours: Temp:  [98.3 F (36.8 C)] 98.3 F (36.8 C) (12/27 2150) Pulse Rate:  [88] 88 (12/27 2150) Resp:  [17] 17 (12/27 2150) BP: (157)/(109) 157/109 mmHg (12/27 2150) SpO2:  [96 %-98 %] 96 % (12/27 2150) Weight:  [72.576 kg (160 lb)] 72.576 kg (160 lb) (12/27 2150)  Physical Exam  Constitutional: Appears well-developed and well-nourished.  Psych: Affect appropriate to situation Eyes: No scleral injection HENT: No OP obstrucion Head: Normocephalic.  Cardiovascular: Normal rate and regular rhythm.  Respiratory: Effort normal and breath sounds normal to anterior ascultation GI: Soft.  No distension. There is no tenderness.  Skin: WDI  Neuro: Mental Status: Patient is awake, alert, oriented to person, place, age, gives month as February She has a mild latency of speech and word finding difficulty, though she is able to name simple objects. Cranial Nerves: II: Visual Fields are full. Pupils are equal, round, and reactive to light.   III,IV, VI: EOMI without ptosis or diploplia.  V: Facial sensation is symmetric to temperature VII: Facial movement is notable for a significant right facial weakness VIII: hearing is intact to voice X: Uvula elevates symmetrically  XI: Shoulder shrug is symmetric. XII: tongue is midline without atrophy or fasciculations.  Motor: Tone is normal. Bulk is normal. 5/5 strength was present on the left side and right leg, she does have a mild distal greater than proximal weakness in the right arm with no drift. Sensory: Sensation is symmetric to light touch and temperature in the arms and legs. Cerebellar: No clear ataxia    I have reviewed labs in epic and the results pertinent to this  consultation are: Mildly elevated creatinine  I have reviewed the images obtained: CT head-small left frontal infarct  Impression: 69 year old female with worsening confusion, right facial droop, slurred speech in the setting of recent strokes. I'm concerned that she may have had a partial seizure, though extension of her stroke would also be a possibility she also has not been eating or drinking, and it is possible that this represents peeling the aneurysm in the setting of dehydration, but her creatinine seems pretty similar to what was previously documented [place].  Recommendations: 1) MRI/MRA head 2) EEG 3) would not start antiepileptics at this time unless further episodes were to occur 4) continue aspirin, Lipitor 5) neurology will continue to follow   Ritta Slot, MD [facility] [phone]  If 7pm- 7am, please page neurology on call as listed in AMION.

## 2014-12-31 NOTE — ED Notes (Signed)
Pt cannot urinate at this time

## 2014-12-31 NOTE — ED Notes (Signed)
Pt to CT

## 2014-12-31 NOTE — ED Provider Notes (Signed)
CSN: [id number]     Arrival date & time 12/31/14  2128 History   First MD Initiated Contact with Patient 12/31/14 2150     Chief Complaint  Patient presents with  . Aphasia     (Consider location/radiation/quality/duration/timing/severity/associated sxs/prior Treatment) HPI Comments: Patient with history of CVA presents to ED tonight after experiencing episode of slurred speech at home.  Time of onset of symptoms is unclear, but seems to be prior to 1700 today.  Patient with residual right lower extremity weakness secondary to prior stroke.  Patient's speech remains slurred, and she is having difficulty expressing her thoughts.  Patient is a 69 y.o. female presenting with Acute Neurological Problem.  Cerebrovascular Accident This is a recurrent problem. The current episode started today. The problem has been waxing and waning. Pertinent negatives include no abdominal pain or visual change.    Past Medical History  Diagnosis Date  . Allergy   . Depression   . GERD (gastroesophageal reflux disease)   . Hyperlipidemia   . Hypertension   . Stroke (HCC)   . Arthritis   . COPD (chronic obstructive pulmonary disease) [facility](HCC)    Past Surgical History  Procedure Laterality Date  . Tubal ligation     No family history on file. Social History  Substance Use Topics  . Smoking status: Unknown If Ever Smoked  . Smokeless tobacco: None  . Alcohol Use: No   OB History    No data available     Review of Systems  Gastrointestinal: Negative for abdominal pain.  Neurological: Positive for facial asymmetry and speech difficulty.  All other systems reviewed and are negative.     Allergies  Plavix; Ciprofloxacin; Acyclovir and related; Amoxicillin; Celebrex; and Penicillins  Home Medications   Prior to Admission medications   Medication Sig Start Date End Date Taking? Authorizing Provider  amLODipine (NORVASC) 10 MG tablet Take 10 mg by mouth daily.    Historical Provider, MD   aspirin 325 MG tablet Take 325 mg by mouth daily.    Historical Provider, MD  aspirin 81 MG tablet Take 81 mg by mouth daily.    Historical Provider, MD  atorvastatin (LIPITOR) 80 MG tablet Take 80 mg by mouth daily.    Historical Provider, MD  ezetimibe (ZETIA) 10 MG tablet Take 10 mg by mouth daily.    Historical Provider, MD  metoprolol tartrate (LOPRESSOR) 25 MG tablet Take 25 mg by mouth 2 (two) times daily.    Historical Provider, MD  omeprazole (PRILOSEC) 40 MG capsule Take 40 mg by mouth daily.    Historical Provider, MD  oxybutynin (DITROPAN) 5 MG tablet Take 5 mg by mouth 2 (two) times daily.    Historical Provider, MD  sertraline (ZOLOFT) 100 MG tablet Take 100 mg by mouth daily.    Historical Provider, MD   BP 157/109 mmHg  Pulse 88  Temp(Src) 98.3 F (36.8 C) (Oral)  Resp 17  Ht 5\' 6"  (1.676 m)  Wt 72.576 kg  BMI 25.84 kg/m2  SpO2 96% Physical Exam  Constitutional: She appears well-developed.  HENT:  Head: Normocephalic.  Eyes: EOM are normal.  Neck: Normal range of motion.  Cardiovascular: Normal rate.   Murmur heard. Pulmonary/Chest: Effort normal and breath sounds normal.  Abdominal: Soft. Bowel sounds are normal.  Musculoskeletal: She exhibits no edema or tenderness.  Lymphadenopathy:    She has no cervical adenopathy.  Neurological: She is alert. GCS eye subscore is 4. GCS verbal subscore is 5. GCS motor  subscore is 6.  Right sided facial ptosis and aphasia, dysmetria. No visual field deficit.  Skin: Skin is warm and dry.  Psychiatric: She has a normal mood and affect.    ED Course  Procedures (including critical care time) Labs Review Labs Reviewed  CBC - Abnormal; Notable for the following:    RBC 3.80 (*)    Hemoglobin 11.1 (*)    HCT 33.4 (*)    All other components within normal limits  COMPREHENSIVE METABOLIC PANEL - Abnormal; Notable for the following:    CO2 20 (*)    Glucose, Bld 100 (*)    Creatinine, Ser 1.46 (*)    Total Protein 6.4  (*)    GFR calc non Af Amer 36 (*)    GFR calc Af Amer 41 (*)    All other components within normal limits  I-STAT CHEM 8, ED - Abnormal; Notable for the following:    Creatinine, Ser 1.20 (*)    Hemoglobin 11.6 (*)    HCT 34.0 (*)    All other components within normal limits  ETHANOL  PROTIME-INR  APTT  DIFFERENTIAL  URINALYSIS, ROUTINE W REFLEX MICROSCOPIC (NOT AT [facility])  URINE RAPID DRUG SCREEN, HOSP PERFORMED  HEMOGLOBIN A1C  LIPID PANEL  I-STAT TROPOININ, ED    Imaging Review Ct Head Wo Contrast  12/31/2014  CLINICAL DATA:  69 year old female with difficulty speaking and walking presenting with confusion and slurred speech. EXAM: CT HEAD WITHOUT CONTRAST TECHNIQUE: Contiguous axial images were obtained from the base of the skull through the vertex without intravenous contrast. COMPARISON:  None. FINDINGS: There is dilatation of the ventricles out of proportion with the sulci which may represent central volume loss versus normal pressure hydrocephalus. Clinical correlation is recommended. Periventricular and deep white matter hypodensities represent chronic microvascular ischemic changes. Small left frontal lobe cortical/subcortical old-appearing infarct and encephalomalacia. There is no intracranial hemorrhage. No mass effect or midline shift identified. The visualized paranasal sinuses and mastoid air cells are well aerated. The calvarium is intact. IMPRESSION: No acute intracranial hemorrhage. 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 If symptoms persist and there are no contraindications, MRI may provide better evaluation if clinically indicated. These results were called by telephone at the time of interpretation on 12/31/2014 at 10:40 pm to Dr. Eber Hong , who verbally acknowledged these results. Electronically Signed   By: Elgie Collard M.D.   On: 12/31/2014 22:47   I have personally reviewed and evaluated these images and  lab results as part of my medical decision-making.   EKG Interpretation   Date/Time:  Tuesday December 31 2014 21:50:09 EST Ventricular Rate:  87 PR Interval:  138 QRS Duration: 79 QT Interval:  376 QTC Calculation: 452 R Axis:   27 Text Interpretation:  Sinus rhythm Borderline low voltage, extremity leads  No old tracing to compare Confirmed by MILLER  MD, BRIAN ([id number]) on  12/31/2014 10:16:41 PM     Patient arrives in ED with stroke symptoms, code stroke initiated. Patient seen by Dr. Hyacinth Meeker. Consult with neurology. Admitted to medicine. MDM   Final diagnoses:  None        Felicie Morn, NP 01/01/15 0136  Eber Hong, MD 01/04/15 [phone]

## 2014-12-31 NOTE — Code Documentation (Signed)
Kelsey Copeland is a 69yo wf [facility] for sudden onset slurred speech and facial droop.  She had a stroke in October which resulted in delayed speech but not slurring and initial facial droop that resolved.  [facility].  Per her daughter around 1800 she developed facial droop and slurred speech and she called EMS to bring her to the hospital.  Her symptoms have waxed and waned since arrival to the ED.  Current NIH 4

## 2015-01-01 ENCOUNTER — Inpatient Hospital Stay (HOSPITAL_COMMUNITY)
Admit: 2015-01-01 | Discharge: 2015-01-01 | Disposition: A | Discharge status: Home / Self Care | Payer: Commercial Managed Care - HMO | Attending: Internal Medicine | Admitting: Internal Medicine

## 2015-01-01 ENCOUNTER — Inpatient Hospital Stay (HOSPITAL_COMMUNITY): Payer: Commercial Managed Care - HMO

## 2015-01-01 ENCOUNTER — Encounter (HOSPITAL_COMMUNITY): Payer: Self-pay | Admitting: *Deleted

## 2015-01-01 DIAGNOSIS — E785 Hyperlipidemia, unspecified: Secondary | ICD-10-CM

## 2015-01-01 DIAGNOSIS — G9340 Encephalopathy, unspecified: Secondary | ICD-10-CM

## 2015-01-01 DIAGNOSIS — R569 Unspecified convulsions: Secondary | ICD-10-CM

## 2015-01-01 DIAGNOSIS — I639 Cerebral infarction, unspecified: Secondary | ICD-10-CM | POA: Insufficient documentation

## 2015-01-01 DIAGNOSIS — N183 Chronic kidney disease, stage 3 (moderate): Secondary | ICD-10-CM

## 2015-01-01 LAB — LIPID PANEL
Cholesterol: 106 mg/dL (ref 0–200)
HDL: 34 mg/dL — ABNORMAL LOW (ref >40)
LDL Cholesterol: 51 mg/dL (ref 0–99)
Total CHOL/HDL Ratio: 3.1 RATIO
Triglycerides: 104 mg/dL (ref <150)
VLDL: 21 mg/dL (ref 0–40)

## 2015-01-01 MED ORDER — SODIUM CHLORIDE 0.9 % IV SOLN
500.0000 mg | Freq: Two times a day (BID) | INTRAVENOUS | Status: DC
  Administered 2015-01-01 – 2015-01-03 (×3): 500 mg via INTRAVENOUS
  Filled 2015-01-01 (×9): qty 5

## 2015-01-01 MED ORDER — STROKE: EARLY STAGES OF RECOVERY BOOK
Freq: Once | Status: DC
  Filled 2015-01-01: qty 1

## 2015-01-01 MED ORDER — METOPROLOL TARTRATE 25 MG PO TABS
25.0000 mg | ORAL_TABLET | Freq: Every day | ORAL | Status: DC
  Administered 2015-01-01 – 2015-01-02 (×2): 25 mg via ORAL
  Filled 2015-01-01 (×3): qty 1

## 2015-01-01 MED ORDER — SENNOSIDES-DOCUSATE SODIUM 8.6-50 MG PO TABS: 1.0000 | ORAL_TABLET | Freq: Every evening | ORAL | Status: DC | PRN

## 2015-01-01 MED ORDER — HEPARIN SODIUM (PORCINE) 5000 UNIT/ML IJ SOLN
5000.0000 [IU] | Freq: Three times a day (TID) | INTRAMUSCULAR | Status: DC
  Administered 2015-01-01 – 2015-01-03 (×8): 5000 [IU] via SUBCUTANEOUS
  Filled 2015-01-01 (×10): qty 1

## 2015-01-01 NOTE — Progress Notes (Addendum)
PATIENT DETAILS Name: Kelsey Copeland Age: 69 y.o. Sex: female Date of Birth: 27-Jan-1945 Admit Date: 12/31/2014 Admitting Physician Lorretta HarpXilin Niu, MD ZOX:[id number]:PERRY,LAWRENCE EDWARD, MD  Subjective: Awake and alert-dysarthric speech-not sure if this is her usual baseline.  Assessment/Plan: Principal Problem: Acute encephalopathy: Seems to have improved significantly, MRI brain shows a possible small infarct in the left occipital lobe-not sure that this can account for her presenting symptoms. 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 Await further recommendations from neurology.  Active Problems: Acute CVA: Recent CVA in October 2015-small CVA seen in the left occipital lobe on MRI this admission. Continue aspirin and statin. Doubt needs any further workup-given recent workup in October (reviewed records in care everywhere).  Essential hypertension: Cautiously continue with amlodipine and metoprolol-holding parameters placed  Dyslipidemia: Continue statin and Zetia  GERD: Continue PPI  Chronic kidney disease stage III: Creatinine closely usual baseline. Follow electrolytes periodically  Depression: Appears stable-continue Zoloft and trazodone   Disposition: Remain inpatient  Antimicrobial agents  See below  Anti-infectives    None      DVT Prophylaxis: Prophylactic Heparin   Code Status: Full code   Family Communication None  Procedures: None  CONSULTS:  neurology  Time spent 30 minutes-Greater than 50% of this time was spent in counseling, explanation of diagnosis, planning of further management, and coordination of care.  MEDICATIONS: Scheduled Meds: .  stroke: mapping our early stages of recovery book   Does not apply Once  . amLODipine  10 mg Oral Daily  . aspirin  325 mg Oral Daily  . atorvastatin  80 mg Oral q1800  . ezetimibe  10 mg Oral Daily  . folic acid  1 mg Oral Daily  . heparin  5,000 Units  Subcutaneous 3 times per day  . metoprolol tartrate  25 mg Oral Daily  . pantoprazole  40 mg Oral Daily  . sertraline  200 mg Oral Daily  . traZODone  50 mg Oral QHS   Continuous Infusions: . sodium chloride 75 mL/hr at 01/01/15 0226   PRN Meds:.senna-docusate    PHYSICAL EXAM: Vital signs in last 24 hours: Filed Vitals:   01/01/15 0800 01/01/15 1000 01/01/15 1200 01/01/15 1400  BP: 126/70 115/59 120/75 131/63  Pulse: 94 93 75 80  Temp: 98.5 F (36.9 C) 97.9 F (36.6 C) 98.4 F (36.9 C) 98 F (36.7 C)  TempSrc: Oral Oral Oral Oral  Resp: 16 17 16 18   Height:      Weight:      SpO2: 94% 95% 96% 96%    Weight change:  Filed Weights   12/31/14 2150 01/01/15 0143  Weight: 72.576 kg (160 lb) 68.221 kg (150 lb 6.4 oz)   Body mass index is 29.37 kg/(m^2).   Gen Exam: Awake and alert-mild dysarthria present Neck: Supple, No JVD.   Chest: B/L Clear.   CVS: S1 S2 Regular, no murmurs.  Abdomen: soft, BS +, non tender, non distended.  Extremities: no edema, lower extremities warm to touch. Neurologic: Non Focal.   Skin: No Rash.   Wounds: N/A.   Intake/Output from previous day: No intake or output data in the 24 hours ending 01/01/15 1450   LAB RESULTS: CBC  Recent Labs Lab 12/31/14 2205 12/31/14 2212  WBC  --  7.3  HGB 11.6* 11.1*  HCT 34.0* 33.4*  PLT  --  219  MCV  --  87.9  MCH  --  29.2  MCHC  --  33.2  RDW  --  15.1  LYMPHSABS  --  1.3  MONOABS  --  0.4  EOSABS  --  0.1  BASOSABS  --  0.0    Chemistries   Recent Labs Lab 12/31/14 2205 12/31/14 2212  NA 142 142  K 3.8 3.9  CL 111 110  CO2  --  20*  GLUCOSE 95 100*  BUN 14 13  CREATININE 1.20* 1.46*  CALCIUM  --  9.4    CBG: No results for input(s): GLUCAP in the last 168 hours.  GFR Estimated Creatinine Clearance: 31.3 mL/min (by C-G formula based on Cr of 1.46).  Coagulation profile  Recent Labs Lab 12/31/14 2212  INR 1.06    Cardiac Enzymes No results for input(s):  CKMB, TROPONINI, MYOGLOBIN in the last 168 hours.  Invalid input(s): CK  Invalid input(s): POCBNP No results for input(s): DDIMER in the last 72 hours. No results for input(s): HGBA1C in the last 72 hours.  Recent Labs  01/01/15 0531  CHOL 106  HDL 34*  LDLCALC 51  TRIG 161  CHOLHDL 3.1   No results for input(s): TSH, T4TOTAL, T3FREE, THYROIDAB in the last 72 hours.  Invalid input(s): FREET3 No results for input(s): VITAMINB12, FOLATE, FERRITIN, TIBC, IRON, RETICCTPCT in the last 72 hours. No results for input(s): LIPASE, AMYLASE in the last 72 hours.  Urine Studies No results for input(s): UHGB, CRYS in the last 72 hours.  Invalid input(s): UACOL, UAPR, USPG, UPH, UTP, UGL, UKET, UBIL, UNIT, UROB, ULEU, UEPI, UWBC, URBC, UBAC, CAST, UCOM, BILUA  MICROBIOLOGY: No results found for this or any previous visit (from the past 240 hour(s)).  RADIOLOGY STUDIES/RESULTS: Ct Head Wo Contrast  12/31/2014  CLINICAL DATA:  69 year old female with difficulty speaking and walking presenting with confusion and slurred speech. EXAM: CT HEAD WITHOUT CONTRAST TECHNIQUE: Contiguous axial images were obtained from the base of the skull through the vertex without intravenous contrast. COMPARISON:  None. FINDINGS: There is dilatation of the ventricles out of proportion with the sulci which may represent central volume loss versus normal pressure hydrocephalus. Clinical correlation is recommended. Periventricular and deep white matter hypodensities represent chronic microvascular ischemic changes. Small left frontal lobe cortical/subcortical old-appearing infarct and encephalomalacia. There is no intracranial hemorrhage. No mass effect or midline shift identified. The visualized paranasal sinuses and mastoid air cells are well aerated. The calvarium is intact. IMPRESSION: No acute intracranial hemorrhage. 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 If  symptoms persist and there are no contraindications, MRI may provide better evaluation if clinically indicated. These results were called by telephone at the time of interpretation on 12/31/2014 at 10:40 pm to Dr. Eber Hong , who verbally acknowledged these results. Electronically Signed   By: Elgie Collard M.D.   On: 12/31/2014 22:47   Mr Brain Wo Contrast  01/01/2015  CLINICAL DATA:  Aphasia. Evaluate stroke. History of chronic kidney disease, hyperlipidemia and hypertension. EXAM: MRI HEAD WITHOUT CONTRAST MRA HEAD WITHOUT CONTRAST TECHNIQUE: Multiplanar, multiecho pulse sequences of the brain and surrounding structures were obtained without intravenous contrast. Angiographic images of the head were obtained using MRA technique without contrast. COMPARISON:  CT head December 31, 2014 FINDINGS: MRI HEAD FINDINGS Multiple sequences are mildly or moderately motion degraded. Subcentimeter focus of reduced diffusion LEFT occipital lobe cortex. Due to small size, limited assessment on ADC map. Moderate ventriculomegaly on the basis of global  parenchymal brain volume loss. Small area LEFT frontal encephalomalacia. Patchy supratentorial and confluent pontine white matter T2 hyperintensities without midline shift, mass effect or mass lesions. No abnormal extra-axial fluid collections. Dural calcifications along anterior falx. Ocular globes and orbital contents are unremarkable though not tailored for evaluation. No abnormal sellar expansion. No suspicious calvarial bone marrow signal. Craniocervical junction maintained. Visualized paranasal sinuses and mastoid air cells are well-aerated. MRA HEAD FINDINGS Moderately motion degraded examination. Anterior circulation: Normal flow related enhancement of the included cervical, petrous, cavernous and supraclinoid internal carotid arteries. Patent anterior communicating artery. Normal flow related enhancement of the anterior and middle cerebral arteries, including  distal segments. LEFT A1 segment is developmentally diminutive. Supernumerary anterior cerebral artery arising from from A-comm artery. No large vessel occlusion, high-grade stenosis, definite aneurysm. Posterior circulation: Codominant vertebral arteries. Basilar artery is patent, with normal flow related enhancement of the main branch vessels. Normal flow related enhancement of the posterior cerebral arteries. Fetal origin LEFT posterior cerebral artery. No large vessel occlusion, high-grade stenosis, definite aneurysm. IMPRESSION: MRI HEAD: Motion degraded examination. Subcentimeter focus of probable acute ischemia LEFT occipital lobe. Moderate to severe chronic small vessel ischemic disease. Moderate global brain atrophy. LEFT frontal lobe encephalomalacia may be posttraumatic or ischemic. MRA HEAD: Moderately motion degraded examination without acute large vessel occlusion or definite high-grade stenosis. Electronically Signed   By: Awilda Metro M.D.   On: 01/01/2015 01:55   Mr Maxine Glenn Head/brain Wo Cm  01/01/2015  CLINICAL DATA:  Aphasia. Evaluate stroke. History of chronic kidney disease, hyperlipidemia and hypertension. EXAM: MRI HEAD WITHOUT CONTRAST MRA HEAD WITHOUT CONTRAST TECHNIQUE: Multiplanar, multiecho pulse sequences of the brain and surrounding structures were obtained without intravenous contrast. Angiographic images of the head were obtained using MRA technique without contrast. COMPARISON:  CT head December 31, 2014 FINDINGS: MRI HEAD FINDINGS Multiple sequences are mildly or moderately motion degraded. Subcentimeter focus of reduced diffusion LEFT occipital lobe cortex. Due to small size, limited assessment on ADC map. Moderate ventriculomegaly on the basis of global parenchymal brain volume loss. Small area LEFT frontal encephalomalacia. Patchy supratentorial and confluent pontine white matter T2 hyperintensities without midline shift, mass effect or mass lesions. No abnormal extra-axial  fluid collections. Dural calcifications along anterior falx. Ocular globes and orbital contents are unremarkable though not tailored for evaluation. No abnormal sellar expansion. No suspicious calvarial bone marrow signal. Craniocervical junction maintained. Visualized paranasal sinuses and mastoid air cells are well-aerated. MRA HEAD FINDINGS Moderately motion degraded examination. Anterior circulation: Normal flow related enhancement of the included cervical, petrous, cavernous and supraclinoid internal carotid arteries. Patent anterior communicating artery. Normal flow related enhancement of the anterior and middle cerebral arteries, including distal segments. LEFT A1 segment is developmentally diminutive. Supernumerary anterior cerebral artery arising from from A-comm artery. No large vessel occlusion, high-grade stenosis, definite aneurysm. Posterior circulation: Codominant vertebral arteries. Basilar artery is patent, with normal flow related enhancement of the main branch vessels. Normal flow related enhancement of the posterior cerebral arteries. Fetal origin LEFT posterior cerebral artery. No large vessel occlusion, high-grade stenosis, definite aneurysm. IMPRESSION: MRI HEAD: Motion degraded examination. Subcentimeter focus of probable acute ischemia LEFT occipital lobe. Moderate to severe chronic small vessel ischemic disease. Moderate global brain atrophy. LEFT frontal lobe encephalomalacia may be posttraumatic or ischemic. MRA HEAD: Moderately motion degraded examination without acute large vessel occlusion or definite high-grade stenosis. Electronically Signed   By: Awilda Metro M.D.   On: 01/01/2015 01:55    Jeoffrey Massed, MD  [facility] Pager:[phone]  If 7PM-7AM, please contact night-coverage [email or web address] Password [email or web address] 01/01/2015, 2:50 PM   LOS: 1 day

## 2015-01-01 NOTE — Care Management Note (Signed)
Case Management Note  Patient Details  Name: Kelsey Copeland MRN: [id number] Date of Birth: 01-09-1945  Subjective/Objective:   Patient admitted with CVA. Patient is from home with family.                 Action/Plan: PT recommends home health PT. Awaiting OT recommendations. CM will continue to follow for discharge needs.  Expected Discharge Date:                  Expected Discharge Plan:  Home w Home Health Services  In-House Referral:     Discharge planning Services     Post Acute Care Choice:    Choice offered to:     DME Arranged:    DME Agency:     HH Arranged:    HH Agency:     Status of Service:  In process, will continue to follow  Medicare Important Message Given:    Date Medicare IM Given:    Medicare IM give by:    Date Additional Medicare IM Given:    Additional Medicare Important Message give by:     If discussed at Long Length of Stay Meetings, dates discussed:    Additional Comments:  [name], RN 01/01/2015, 4:40 PM

## 2015-01-01 NOTE — Progress Notes (Signed)
Utilization Review Completed.Joson Sapp T12/28/2016  

## 2015-01-01 NOTE — ED Notes (Signed)
Patient transported to MRI 

## 2015-01-01 NOTE — Procedures (Signed)
EEG report.  Brief clinical history: 69 year old female with worsening confusion, right facial droop, slurred speech in the setting of recent strokes.  Technique: this is a 17 channel routine scalp EEG performed at the bedside with bipolar and monopolar montages arranged in accordance to the international 10/20 system of electrode placement. One channel was dedicated to EKG recording.  The study was performed during wakefulness, drowsiness, and stage 2 sleep. No activating procedures performed.   Description:In the wakeful state, the best background consisted of a medium amplitude, posterior dominant, well sustained, symmetric and reactive 10 Hz rhythm. Drowsiness demonstrated dropout of the alpha rhythm. Stage 2 sleep showed symmetric and synchronous sleep spindles without intermixed epileptiform discharges. Frequent intermittent theta slowing over the left fronto-centro-temporal region with evidence of less frequent left frontal intermittent delta (FIRDA) slowing. No focal or generalized epileptiform discharges noted.  EKG showed sinus rhythm.   Impression: this is an abnormal awake and asleep EEG because of the above described findings. The presence of intermittent theta slowing over the left hemisphere may suggest focal cerebral dysfunction in that region. Left FIRDA could be indicative of focal structural lesion over the left frontal region or be part of an encephalopathic non specific process. No electrographic seizures noted. Clinical correlation is advised.   Kelsey Portelasvaldo Chanley Mcenery, MD [facility]

## 2015-01-01 NOTE — Progress Notes (Signed)
Patient arrived via ED tech complaining of no pain. Oriented to room and equipment. Will continue to monitor. Suzy Bouchardhompson, Lewanda Perea E, [place]RN 01/01/2015 [phone]

## 2015-01-01 NOTE — Progress Notes (Signed)
Subjective: Patient had no complaints. No recurrence of altered mental status reported since admission.  Objective: Current vital signs: BP 131/63 mmHg  Pulse 80  Temp(Src) 98 F (36.7 C) (Oral)  Resp 18  Ht 5' (1.524 m)  Wt 68.221 kg (150 lb 6.4 oz)  BMI 29.37 kg/m2  SpO2 96%  Neurologic Exam: Patient was alert and in no acute distress. She had moderately severe expressive aphasia. There was no receptive aphasia and she was able to follow commands well. Extraocular movements were full and conjugate. Face was symmetrical. Patient moved extremities equally with no weakness.  MRI showed a subcentimeter focus of acute ischemia involving the left occipital lobe. Left frontal lobe encephalomalacia is also noted.  EEG is abnormal with lowering involving the left hemisphere indicative of probable structural lesion involving the left frontal region. No seizure activity was reported.  Medications: I have reviewed the patient's current medications.  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 As well MRI shows findings indicative of small acute left occipital infarction. Significance of expressive aphasia is unclear, with no evidence of an acute left MCA territory through infarction, and no evidence of focal or activity noted on EEG study. This may be a and a for station of prolonged postictal state.  Plan: 1. Will start Keppra 500 mg twice a day 2. Stroke workup to include carotid Doppler study 2-D echocardiogram as well as an A1c and fasting lipid panel. 3. Continue aspirin daily 25 mg per day 4. Follow-up with stroke team starting on 01/02/2015  C.R. Roseanne RenoStewart, MD [facility] [phone]  01/01/2015  6:08 PM

## 2015-01-01 NOTE — Evaluation (Signed)
Speech Language Pathology Evaluation Patient Details Name: Kelsey Copeland MRN: [id number] DOB: [birth year] Today's Date: 01/01/2015 Time: 8295-6213 SLP Time Calculation (min) (ACUTE ONLY): 40 min  Problem List:  Patient Active Problem List   Diagnosis Date Noted  . 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 Acute encephalopathy 01/01/2015  . HLD (hyperlipidemia) 12/31/2014  . COPD (chronic obstructive pulmonary disease) (HCC) 12/31/2014  . GERD (gastroesophageal reflux disease) 12/31/2014  . CKD (chronic kidney disease), stage III   . Arthritis of knee 08/20/2014  . CVA (cerebral vascular accident) (HCC) 06/25/2014  . Dry mouth 06/25/2014  . Essential hypertension 06/25/2014  . Carotid artery syndrome hemispheric 02/27/2014   Past Medical History:  Past Medical History  Diagnosis Date  . Allergy   . Depression   . GERD (gastroesophageal reflux disease)   . Hyperlipidemia   . Hypertension   . Stroke (HCC)   . Arthritis   . COPD (chronic obstructive pulmonary disease) (HCC)   . CKD (chronic kidney disease), stage III    Past Surgical History:  Past Surgical History  Procedure Laterality Date  . Tubal ligation     HPI:  69 yo female adm to [facility] with aphasia, confusion.  PMH + for CVA (although pt denies), COPD, xerostomia.  Speech evaluation ordered.  MRI showed old left frontal cva, LEFT frontal lobe encephalomalacia may be posttraumatic or ischemic, Subcentimeter focus of probable acute ischemia LEFT occipital lobe.   Assessment / Plan / Recommendation Clinical Impression  Pt presents with mild dysarthria, expressive > receptive language and motor planning difficulties. CN deficits impacting facial, trigeminal, hypoglossal nerves noted.  Pauses, groping for words noted resulted in dysfluent output.  Strengths include receptive language skills.  Use of phonemic, written choices and first letter cue helpful for expressive communication.  In additiion, use of pictures faciliates  pt's communication.  Pt with ? memory deficits as she did not recall SLP was obtaining orange juice and towel for her but did recall with cue.  Pt also denies h/o CVA.  Followup SLP indicated to help maximize pt communication - SLP educated pt to evaluation findings and compensation strategies.  No family present to establish baseline but suspect baseline deficits from June 2016 CVA however pt does admit to worsening expressive language skills at this tme.     SLP Assessment  Patient needs continued Speech Lanaguage Pathology Services    Follow Up Recommendations  Inpatient Rehab    Frequency and Duration min 2x/week  1 week      SLP Evaluation Prior Functioning  Cognitive/Linguistic Baseline: Information not available (no family present, pt denies h/o cva but admits to some language deficits)  Lives With: Family Vocation: Retired   Engineer, building services Level: Oriented to person;Oriented to time;Oriented to situation (oriented to year with choice of 2, month independently, location with written choice) Attention: Sustained Sustained Attention: Appears intact Memory: Impaired Memory Impairment: Retrieval deficit;Decreased recall of new information (impact of language deficit more significant) Awareness: Appears intact Problem Solving: Appears intact    Comprehension  Auditory Comprehension Overall Auditory Comprehension: Impaired Yes/No Questions: Within Functional Limits (3/3 basic, 6/7 factual) Commands: Impaired One Step Basic Commands: 75-100% accurate Two Step Basic Commands: 50-74% accurate (1/2 steps, suspect motor planning impact) Conversation: Simple Visual Recognition/Discrimination Discrimination: Not tested Reading Comprehension Reading Status: Impaired Word level: Within functional limits Sentence Level: Impaired Interfering Components: Processing time;Eye glasses not available Effective Techniques: Large print    Expression Expression Primary Mode of  Expression: Verbal Verbal Expression  Overall Verbal Expression: Impaired (uncertain to baseline measures) Initiation: Impaired Automatic Speech: Name;Social Response;Counting;Month of year Level of Generative/Spontaneous Verbalization: Sentence Repetition: Impaired Level of Impairment: Word level (multisyllabic level) Naming: Impairment Responsive: 76-100% accurate Confrontation: Impaired Other Naming Comments: named 4/5 pictures independently - 1 with first letter cue, 3/4 items (one with visual cue)  Verbal Errors: Phonemic paraphasias Pragmatics: No impairment Effective Techniques: Semantic cues;Sentence completion;Phonemic cues;Written cues Written Expression Dominant Hand: Right Written Expression: Not tested (pt with right handed weakness )   Oral / Motor Motor Speech Overall Motor Speech: Impaired Respiration: Within functional limits Articulation: Impaired Level of Impairment: Phrase Intelligibility: Intelligible Motor Planning: Impaired Level of Impairment: Word Motor Speech Errors: Groping for words;Inconsistent;Aware Interfering Components: Premorbid status Effective Techniques: Over-articulate;Slow rate    Donavan Burnetamara Jazlen Ogarro, MS [facility] SLP [phone]

## 2015-01-01 NOTE — Progress Notes (Signed)
Attempted report once. Melina Schoolshompson, Catalaya Garr E, RN 01/01/2015 12:43 AM

## 2015-01-01 NOTE — Progress Notes (Signed)
EEG completed; results pending.    

## 2015-01-01 NOTE — Progress Notes (Signed)
OT Cancellation Note  Patient Details Name: Kelsey GreavesGail D Copeland MRN: [id number] DOB: 1945/09/30   Cancelled Treatment:    Reason Eval/Treat Not Completed: Other (comment) (Pt on bed rest.)  Earlie RavelingStraub, Sherylann Vangorden L OTR/L [phone] 01/01/2015, 2:58 PM

## 2015-01-01 NOTE — Evaluation (Signed)
Physical Therapy Evaluation Patient Details Name: Kelsey Copeland MRN: [id number] DOB: 04-18-1945 Today's Date: 01/01/2015   History of Present Illness  69 y.o. female with PMH of hypertension, hyperlipidemia, stroke, arthritis, chronic kidney disease-stage III, GERD, depression, who presents with worsening aphasia and confusion.  MRI shows L occipital lobe possible acute ischemia.  Clinical Impression  Pt admitted with above diagnosis. Pt currently with functional limitations due to the deficits listed below (see PT Problem List).  Pt will benefit from skilled PT to increase their independence and safety with mobility to allow discharge to the venue listed below.  Pt moving at MIN to MIN/guard level, but eval was limited by dizziness, so was not able to get a good assessment of gait with RW.  Some difficulty with getting PLOF due to aphasia, but pt stated that when she walked at home someone was always with her for S.  At this time, recommend HHPT.     Follow Up Recommendations Home health PT;Supervision for mobility/OOB;Supervision/Assistance - 24 hour    Equipment Recommendations  None recommended by PT    Recommendations for Other Services       Precautions / Restrictions Precautions Precautions: Fall Restrictions Weight Bearing Restrictions: No      Mobility  Bed Mobility Overal bed mobility: Needs Assistance Bed Mobility: Supine to Sit     Supine to sit: Min guard;HOB elevated     General bed mobility comments: Use of rail and HOB elevated, but able to do with cueing and some difficulty, but no physical assistance given by PT.  Transfers Overall transfer level: Needs assistance Equipment used: Rolling walker (2 wheeled) Transfers: Sit to/from Stand Sit to Stand: Min assist         General transfer comment: MIN A to power up and for hand placement on 2nd transfer.  Pt c/o dizziness.  Ambulation/Gait Ambulation/Gait assistance: Min guard Ambulation Distance  (Feet): 3 Feet Assistive device: Rolling walker (2 wheeled) Gait Pattern/deviations: Decreased stride length     General Gait Details: Gait limited to transfer to recliner due to pt feeling dizzy and requesting to sit down.  BP in sitting 130/77 HR 95- nursing informed.  Stairs            Wheelchair Mobility    Modified Rankin (Stroke Patients Only) Modified Rankin (Stroke Patients Only) Pre-Morbid Rankin Score: Moderately severe disability Modified Rankin: Moderately severe disability     Balance Overall balance assessment: Needs assistance Sitting-balance support: Feet supported Sitting balance-Leahy Scale: Fair       Standing balance-Leahy Scale: Poor Standing balance comment: requires RW for support in standing                             Pertinent Vitals/Pain Pain Assessment: No/denies pain    Home Living Family/patient expects to be discharged to:: Private residence Living Arrangements: Children Available Help at Discharge: Available PRN/intermittently Type of Home: House Home Access: Stairs to enter Entrance Stairs-Rails: Can reach both;Left;Right   Home Layout: Able to live on main level with bedroom/bathroom Home Equipment: Walker - 2 wheels;Wheelchair - manual      Prior Function Level of Independence: Needs assistance   Gait / Transfers Assistance Needed: Pt state she had used a RW, but cannot remember what she was using before she came to hospital. Says someone is with her when she walks.       Comments: Due to aphasia had to ask pt questions and  she would say yes or no and sometimes elaborate.     Hand Dominance   Dominant Hand: Right    Extremity/Trunk Assessment   Upper Extremity Assessment: Defer to OT evaluation           Lower Extremity Assessment: RLE deficits/detail;LLE deficits/detail RLE Deficits / Details: Hip flexion 4-/5, quads 4/5, ankle df 4-/5 LLE Deficits / Details: hip flex 4/5, quads 4+/5, ankle df  4/5  Cervical / Trunk Assessment: Normal  Communication   Communication: Expressive difficulties  Cognition Arousal/Alertness: Awake/alert Behavior During Therapy: WFL for tasks assessed/performed Overall Cognitive Status: Within Functional Limits for tasks assessed                      General Comments      Exercises        Assessment/Plan    PT Assessment Patient needs continued PT services  PT Diagnosis Difficulty walking   PT Problem List Decreased balance;Decreased mobility;Decreased coordination;Decreased activity tolerance;Decreased knowledge of use of DME  PT Treatment Interventions DME instruction;Gait training;Functional mobility training;Therapeutic exercise;Therapeutic activities;Neuromuscular re-education;Balance training;Stair training   PT Goals (Current goals can be found in the Care Plan section) Acute Rehab PT Goals Patient Stated Goal: go home with family PT Goal Formulation: With patient Time For Goal Achievement: 01/15/15 Potential to Achieve Goals: Good    Frequency Min 4X/week   Barriers to discharge        Co-evaluation               End of Session Equipment Utilized During Treatment: Gait belt;Oxygen Activity Tolerance: Treatment limited secondary to medical complications (Comment);Patient limited by fatigue (c/o dizziness) Patient left: in chair;with call bell/phone within reach;with chair alarm set Nurse Communication: Mobility status;Other (comment) (c/o dizziness and BP and HR)         Time: 1057-1130 PT Time Calculation (min) (ACUTE ONLY): 33 min   Charges:   PT Evaluation $Initial PT Evaluation Tier I: 1 Procedure PT Treatments $Therapeutic Activity: 8-22 mins   PT G Codes:        Kelsey Copeland 01/01/2015, 12:49 PM

## 2015-01-02 ENCOUNTER — Inpatient Hospital Stay (HOSPITAL_COMMUNITY): Payer: Commercial Managed Care - HMO

## 2015-01-02 DIAGNOSIS — I639 Cerebral infarction, unspecified: Secondary | ICD-10-CM

## 2015-01-02 DIAGNOSIS — I63432 Cerebral infarction due to embolism of left posterior cerebral artery: Secondary | ICD-10-CM

## 2015-01-02 DIAGNOSIS — I10 Essential (primary) hypertension: Secondary | ICD-10-CM

## 2015-01-02 DIAGNOSIS — R609 Edema, unspecified: Secondary | ICD-10-CM

## 2015-01-02 LAB — URINALYSIS, ROUTINE W REFLEX MICROSCOPIC
BILIRUBIN URINE: NEGATIVE
Glucose, UA: NEGATIVE mg/dL
HGB URINE DIPSTICK: NEGATIVE
Ketones, ur: NEGATIVE mg/dL
Leukocytes, UA: NEGATIVE
NITRITE: NEGATIVE
PROTEIN: NEGATIVE mg/dL
SPECIFIC GRAVITY, URINE: 1.014 (ref 1.005–1.030)
pH: 5.5 (ref 5.0–8.0)

## 2015-01-02 LAB — HEMOGLOBIN A1C
HEMOGLOBIN A1C: 5.8 % — AB (ref 4.8–5.6)
MEAN PLASMA GLUCOSE: 120 mg/dL

## 2015-01-02 LAB — RAPID URINE DRUG SCREEN, HOSP PERFORMED
AMPHETAMINES: NOT DETECTED
BARBITURATES: NOT DETECTED
BENZODIAZEPINES: NOT DETECTED
Cocaine: NOT DETECTED
Opiates: NOT DETECTED
TETRAHYDROCANNABINOL: NOT DETECTED

## 2015-01-02 NOTE — Progress Notes (Signed)
  Echocardiogram 2D Echocardiogram has been performed.  Janalyn HarderWest, Jannifer Fischler R 01/02/2015, 11:14 AM

## 2015-01-02 NOTE — Progress Notes (Signed)
PATIENT DETAILS Name: Kelsey GreavesGail D Finnicum Age: 69 y.o. Sex: female Date of Birth: September 08, 1945 Admit Date: 12/31/2014 Admitting Physician Lorretta HarpXilin Niu, MD ZOX:[id number]:PERRY,LAWRENCE EDWARD, MD  Subjective: Awake and alert- speech better  Assessment/Plan: Principal Problem: Acute encephalopathy: resolved, MRI brain shows a possible small infarct in the left occipital lobe-not sure that this can account for her presenting symptoms. No EEG evidence of seizures. 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  Active Problems: Acute CVA: Recent CVA in October 2015-small CVA seen in the left occipital lobe on MRI this admission. Continue aspirin and statin. Neurology recommending TEE/loop recorder.  Essential hypertension: Cautiously continue with amlodipine and metoprolol-holding parameters placed  Dyslipidemia: Continue statin and Zetia. LDL 51  GERD: Continue PPI  Chronic kidney disease stage III: Creatinine closely usual baseline. Follow electrolytes periodically  Depression: Appears stable-continue Zoloft and trazodone   Disposition: Remain inpatient-home health PT/OT on discharge  Antimicrobial agents  See below  Anti-infectives    None      DVT Prophylaxis: Prophylactic Heparin   Code Status: Full code   Family Communication None  Procedures: None  CONSULTS:  neurology  Time spent 30 minutes-Greater than 50% of this time was spent in counseling, explanation of diagnosis, planning of further management, and coordination of care.  MEDICATIONS: Scheduled Meds: .  stroke: mapping our early stages of recovery book   Does not apply Once  . amLODipine  10 mg Oral Daily  . aspirin  325 mg Oral Daily  . atorvastatin  80 mg Oral q1800  . ezetimibe  10 mg Oral Daily  . folic acid  1 mg Oral Daily  . heparin  5,000 Units Subcutaneous 3 times per day  . levETIRAcetam  500 mg Intravenous Q12H  . metoprolol tartrate   25 mg Oral Daily  . pantoprazole  40 mg Oral Daily  . sertraline  200 mg Oral Daily  . traZODone  50 mg Oral QHS   Continuous Infusions:   PRN Meds:.senna-docusate    PHYSICAL EXAM: Vital signs in last 24 hours: Filed Vitals:   01/01/15 2132 01/02/15 0111 01/02/15 0556 01/02/15 1004  BP: 114/50 120/53 148/64 131/67  Pulse: 80 76 85 84  Temp: 98.1 F (36.7 C) 98 F (36.7 C) 98 F (36.7 C) 98.2 F (36.8 C)  TempSrc: Oral Oral Oral Oral  Resp: 18 18 20 20   Height:      Weight:      SpO2: 96% 98% 96% 92%    Weight change:  Filed Weights   12/31/14 2150 01/01/15 0143  Weight: 72.576 kg (160 lb) 68.221 kg (150 lb 6.4 oz)   Body mass index is 29.37 kg/(m^2).   Gen Exam: Awake and alert-mild dysarthria present Neck: Supple, No JVD.   Chest: B/L Clear.   CVS: S1 S2 Regular, no murmurs.  Abdomen: soft, BS +, non tender, non distended.  Extremities: no edema, lower extremities warm to touch. Neurologic: Non Focal.   Skin: No Rash.   Wounds: N/A.   Intake/Output from previous day:  Intake/Output Summary (Last 24 hours) at 01/02/15 1322 Last data filed at 01/02/15 0900  Gross per 24 hour  Intake    240 ml  Output      0 ml  Net    240 ml     LAB RESULTS: CBC  Recent Labs Lab 12/31/14 2205 12/31/14 2212  WBC  --  7.3  HGB 11.6* 11.1*  HCT 34.0* 33.4*  PLT  --  219  MCV  --  87.9  MCH  --  29.2  MCHC  --  33.2  RDW  --  15.1  LYMPHSABS  --  1.3  MONOABS  --  0.4  EOSABS  --  0.1  BASOSABS  --  0.0    Chemistries   Recent Labs Lab 12/31/14 2205 12/31/14 2212  NA 142 142  K 3.8 3.9  CL 111 110  CO2  --  20*  GLUCOSE 95 100*  BUN 14 13  CREATININE 1.20* 1.46*  CALCIUM  --  9.4    CBG: No results for input(s): GLUCAP in the last 168 hours.  GFR Estimated Creatinine Clearance: 31.3 mL/min (by C-G formula based on Cr of 1.46).  Coagulation profile  Recent Labs Lab 12/31/14 2212  INR 1.06    Cardiac Enzymes No results for  input(s): CKMB, TROPONINI, MYOGLOBIN in the last 168 hours.  Invalid input(s): CK  Invalid input(s): POCBNP No results for input(s): DDIMER in the last 72 hours.  Recent Labs  01/01/15 0531  HGBA1C 5.8*    Recent Labs  01/01/15 0531  CHOL 106  HDL 34*  LDLCALC 51  TRIG 161  CHOLHDL 3.1   No results for input(s): TSH, T4TOTAL, T3FREE, THYROIDAB in the last 72 hours.  Invalid input(s): FREET3 No results for input(s): VITAMINB12, FOLATE, FERRITIN, TIBC, IRON, RETICCTPCT in the last 72 hours. No results for input(s): LIPASE, AMYLASE in the last 72 hours.  Urine Studies No results for input(s): UHGB, CRYS in the last 72 hours.  Invalid input(s): UACOL, UAPR, USPG, UPH, UTP, UGL, UKET, UBIL, UNIT, UROB, ULEU, UEPI, UWBC, URBC, UBAC, CAST, UCOM, BILUA  MICROBIOLOGY: No results found for this or any previous visit (from the past 240 hour(s)).  RADIOLOGY STUDIES/RESULTS: Ct Head Wo Contrast  12/31/2014  CLINICAL DATA:  69 year old female with difficulty speaking and walking presenting with confusion and slurred speech. EXAM: CT HEAD WITHOUT CONTRAST TECHNIQUE: Contiguous axial images were obtained from the base of the skull through the vertex without intravenous contrast. COMPARISON:  None. FINDINGS: There is dilatation of the ventricles out of proportion with the sulci which may represent central volume loss versus normal pressure hydrocephalus. Clinical correlation is recommended. Periventricular and deep white matter hypodensities represent chronic microvascular ischemic changes. Small left frontal lobe cortical/subcortical old-appearing infarct and encephalomalacia. There is no intracranial hemorrhage. No mass effect or midline shift identified. The visualized paranasal sinuses and mastoid air cells are well aerated. The calvarium is intact. IMPRESSION: No acute intracranial hemorrhage. 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  If symptoms persist and there are no contraindications, MRI may provide better evaluation if clinically indicated. These results were called by telephone at the time of interpretation on 12/31/2014 at 10:40 pm to Dr. Eber Hong , who verbally acknowledged these results. Electronically Signed   By: Elgie Collard M.D.   On: 12/31/2014 22:47   Mr Brain Wo Contrast  01/01/2015  CLINICAL DATA:  Aphasia. Evaluate stroke. History of chronic kidney disease, hyperlipidemia and hypertension. EXAM: MRI HEAD WITHOUT CONTRAST MRA HEAD WITHOUT CONTRAST TECHNIQUE: Multiplanar, multiecho pulse sequences of the brain and surrounding structures were obtained without intravenous contrast. Angiographic images of the head were obtained using MRA technique without contrast. COMPARISON:  CT head December 31, 2014 FINDINGS: MRI HEAD FINDINGS Multiple sequences are mildly or moderately motion degraded. Subcentimeter focus of reduced  diffusion LEFT occipital lobe cortex. Due to small size, limited assessment on ADC map. Moderate ventriculomegaly on the basis of global parenchymal brain volume loss. Small area LEFT frontal encephalomalacia. Patchy supratentorial and confluent pontine white matter T2 hyperintensities without midline shift, mass effect or mass lesions. No abnormal extra-axial fluid collections. Dural calcifications along anterior falx. Ocular globes and orbital contents are unremarkable though not tailored for evaluation. No abnormal sellar expansion. No suspicious calvarial bone marrow signal. Craniocervical junction maintained. Visualized paranasal sinuses and mastoid air cells are well-aerated. MRA HEAD FINDINGS Moderately motion degraded examination. Anterior circulation: Normal flow related enhancement of the included cervical, petrous, cavernous and supraclinoid internal carotid arteries. Patent anterior communicating artery. Normal flow related enhancement of the anterior and middle cerebral arteries, including  distal segments. LEFT A1 segment is developmentally diminutive. Supernumerary anterior cerebral artery arising from from A-comm artery. No large vessel occlusion, high-grade stenosis, definite aneurysm. Posterior circulation: Codominant vertebral arteries. Basilar artery is patent, with normal flow related enhancement of the main branch vessels. Normal flow related enhancement of the posterior cerebral arteries. Fetal origin LEFT posterior cerebral artery. No large vessel occlusion, high-grade stenosis, definite aneurysm. IMPRESSION: MRI HEAD: Motion degraded examination. Subcentimeter focus of probable acute ischemia LEFT occipital lobe. Moderate to severe chronic small vessel ischemic disease. Moderate global brain atrophy. LEFT frontal lobe encephalomalacia may be posttraumatic or ischemic. MRA HEAD: Moderately motion degraded examination without acute large vessel occlusion or definite high-grade stenosis. Electronically Signed   By: Awilda Metro M.D.   On: 01/01/2015 01:55   Mr Maxine Glenn Head/brain Wo Cm  01/01/2015  CLINICAL DATA:  Aphasia. Evaluate stroke. History of chronic kidney disease, hyperlipidemia and hypertension. EXAM: MRI HEAD WITHOUT CONTRAST MRA HEAD WITHOUT CONTRAST TECHNIQUE: Multiplanar, multiecho pulse sequences of the brain and surrounding structures were obtained without intravenous contrast. Angiographic images of the head were obtained using MRA technique without contrast. COMPARISON:  CT head December 31, 2014 FINDINGS: MRI HEAD FINDINGS Multiple sequences are mildly or moderately motion degraded. Subcentimeter focus of reduced diffusion LEFT occipital lobe cortex. Due to small size, limited assessment on ADC map. Moderate ventriculomegaly on the basis of global parenchymal brain volume loss. Small area LEFT frontal encephalomalacia. Patchy supratentorial and confluent pontine white matter T2 hyperintensities without midline shift, mass effect or mass lesions. No abnormal extra-axial  fluid collections. Dural calcifications along anterior falx. Ocular globes and orbital contents are unremarkable though not tailored for evaluation. No abnormal sellar expansion. No suspicious calvarial bone marrow signal. Craniocervical junction maintained. Visualized paranasal sinuses and mastoid air cells are well-aerated. MRA HEAD FINDINGS Moderately motion degraded examination. Anterior circulation: Normal flow related enhancement of the included cervical, petrous, cavernous and supraclinoid internal carotid arteries. Patent anterior communicating artery. Normal flow related enhancement of the anterior and middle cerebral arteries, including distal segments. LEFT A1 segment is developmentally diminutive. Supernumerary anterior cerebral artery arising from from A-comm artery. No large vessel occlusion, high-grade stenosis, definite aneurysm. Posterior circulation: Codominant vertebral arteries. Basilar artery is patent, with normal flow related enhancement of the main branch vessels. Normal flow related enhancement of the posterior cerebral arteries. Fetal origin LEFT posterior cerebral artery. No large vessel occlusion, high-grade stenosis, definite aneurysm. IMPRESSION: MRI HEAD: Motion degraded examination. Subcentimeter focus of probable acute ischemia LEFT occipital lobe. Moderate to severe chronic small vessel ischemic disease. Moderate global brain atrophy. LEFT frontal lobe encephalomalacia may be posttraumatic or ischemic. MRA HEAD: Moderately motion degraded examination without acute large vessel occlusion or definite high-grade stenosis. Electronically Signed  By: Awilda Metro M.D.   On: 01/01/2015 01:55    Jeoffrey Massed, MD  [facility] Pager:[phone]  If 7PM-7AM, please contact night-coverage [email or web address] Password [email or web address] 01/02/2015, 1:22 PM   LOS: 2 days

## 2015-01-02 NOTE — Clinical Social Work Note (Signed)
CSW received phone call from patient's family member Perley JainSandra Tschida who said they will not be able to provide 24 hour supervision for patient and they would like patient to go to a SNF for short term rehab.  CSW will work on trying to find placement for patient.  Ervin KnackEric R. Audreyanna Butkiewicz, MSW, LCSWA [phone] 01/02/2015 11:41 AM

## 2015-01-02 NOTE — Clinical Social Work Placement (Addendum)
   CLINICAL SOCIAL WORK PLACEMENT  NOTE  Date:  01/02/2015  Patient Details  Name: Kelsey Copeland MRN: [id number] Date of Birth: 06/12/1945  Clinical Social Work is seeking post-discharge placement for this patient at the Skilled  Nursing Facility level of care (*CSW will initial, date and re-position this form in  chart as items are completed):  Yes   Patient/family provided with [employer] Clinical Social Work Department's list of facilities offering this level of care within the geographic area requested by the patient (or if unable, by the patient's family).  Yes   Patient/family informed of their freedom to choose among providers that offer the needed level of care, that participate in Medicare, Medicaid or managed care program needed by the patient, have an available bed and are willing to accept the patient.  Yes   Patient/family informed of [facility] ownership interest in [facility] and [facility], as well as of the fact that they are under no obligation to receive care at these facilities.  PASRR submitted to EDS on 01/02/15     PASRR number received on       Existing PASRR number confirmed on 01/02/15     FL2 transmitted to all facilities in geographic area requested by pt/family on 01/02/15     FL2 transmitted to all facilities within larger geographic area on 01/02/15     Patient informed that his/her managed care company has contracts with or will negotiate with certain facilities, including the following:        Yes   Patient/family informed of bed offers received.  [facility]     Physician recommends and patient chooses bed at      [facility] on  01/03/15.  Patient to be transferred to facility by  [facility]     Patient family notified on  01/03/15 of transfer.  Name of family member notified:   Kelsey Copeland     PHYSICIAN Please sign DNR, Please sign FL2      Additional Comment:    _______________________________________________ Darleene CleaverAnterhaus, Laurana Magistro R, LCSWA 01/02/2015, 6:22 PM

## 2015-01-02 NOTE — Clinical Social Work Note (Signed)
CSW contacted patient's sister in law and also spoke to patient in regards to bed offers.  CSW presented bed offers to patient and her family and they [facility].  [facility] and confirmed they can take patient on Friday if she is medically ready for discharge and orders have been received.  Physician please sign FL2 in Epic and DNR on chart.  CSW to continue to follow patient's progress.  Ervin KnackEric R. Kalep Full, MSW, Theresia MajorsLCSWA [phone] 01/02/2015 6:11 PM

## 2015-01-02 NOTE — [place] FL2 (Signed)
[place] MEDICAID FL2 LEVEL OF CARE SCREENING TOOL     IDENTIFICATION  Patient Name: Kelsey Copeland Birthdate: [birth year] Sex: female Admission Date (Current Location): 12/31/2014  [facility] [place]  [employer]   Facility and Address:  [facility] [place], [address], [place], [place] [address]      Provider Number: [id number]  Attending Physician Name and Address:  Maretta BeesShanker M Ghimire, MD  Relative Name and Phone Number:  Nani SkillernSandra Hogue, Other [phone]    Current Level of Care: Hospital Recommended Level of Care: Skilled Nursing Facility Prior Approval Number:    Date Approved/Denied:   PASRR Number:  [id number]   Discharge Plan: SNF    Current Diagnoses: Patient Active Problem List   Diagnosis Date Noted  . 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 Acute encephalopathy 01/01/2015  . Acute ischemic stroke (HCC)   . Seizures (HCC)   . HLD (hyperlipidemia) 12/31/2014  . COPD (chronic obstructive pulmonary disease) (HCC) 12/31/2014  . GERD (gastroesophageal reflux disease) 12/31/2014  . CKD (chronic kidney disease), stage III   . Arthritis of knee 08/20/2014  . CVA (cerebral vascular accident) (HCC) 06/25/2014  . Dry mouth 06/25/2014  . Essential hypertension 06/25/2014  . 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  BEHAVIORAL SYMPTOMS/MOOD NEUROLOGICAL BOWEL NUTRITION STATUS      Continent Diet (Carb Modified)  AMBULATORY STATUS COMMUNICATION OF NEEDS Skin   Limited Assist Verbally Normal                       Personal Care Assistance Level of Assistance  Bathing, Dressing Bathing Assistance: Limited assistance   Dressing Assistance: Limited assistance     Functional Limitations Info             SPECIAL CARE FACTORS FREQUENCY  PT (By licensed PT), OT (By licensed OT)     PT Frequency: 5x a week OT  Frequency: 3 x a week            Contractures      Additional Factors Info  Code Status, Allergies, Psychotropic Code Status Info: DNR Allergies Info: PLAVIX, AMOXICILLIN, CIPROFLOXACIN, DIPYRONE, ACYCLOVIR AND RELATED, CELEBREX, PENICILLINS Psychotropic Info: Zoloft         Current Medications (01/02/2015):  This is the current hospital active medication list Current Facility-Administered Medications  Medication Dose Route Frequency Provider Last Rate Last Dose  .  stroke: mapping our early stages of recovery book   Does not apply Once Lorretta HarpXilin Niu, MD      . amLODipine (NORVASC) tablet 10 mg  10 mg Oral Daily Maretta BeesShanker M Ghimire, MD   10 mg at 01/02/15 1040  . aspirin tablet 325 mg  325 mg Oral Daily Lorretta HarpXilin Niu, MD   325 mg at 01/02/15 1040  . atorvastatin (LIPITOR) tablet 80 mg  80 mg Oral q1800 Lorretta HarpXilin Niu, MD   80 mg at 01/02/15 1731  . ezetimibe (ZETIA) tablet 10 mg  10 mg Oral Daily Lorretta HarpXilin Niu, MD   10 mg at 01/02/15 1040  . folic acid (FOLVITE) tablet 1 mg  1 mg Oral Daily Lorretta HarpXilin Niu, MD   1 mg at 01/02/15 1040  . heparin injection 5,000 Units  5,000 Units Subcutaneous 3 times per day Lorretta HarpXilin Niu, MD   5,000 Units at 01/02/15 1348  . levETIRAcetam (KEPPRA) 500 mg in sodium chloride  0.9 % 100 mL IVPB  500 mg Intravenous Q12H [place]   500 mg at 01/02/15 1731  . metoprolol tartrate (LOPRESSOR) tablet 25 mg  25 mg Oral Daily Maretta Bees, MD   25 mg at 01/02/15 1040  . pantoprazole (PROTONIX) EC tablet 40 mg  40 mg Oral Daily Lorretta Harp, MD   40 mg at 01/02/15 1040  . senna-docusate (Senokot-S) tablet 1 tablet  1 tablet Oral QHS PRN Lorretta Harp, MD      . sertraline (ZOLOFT) tablet 200 mg  200 mg Oral Daily Lorretta Harp, MD   200 mg at 01/02/15 1040  . traZODone (DESYREL) tablet 50 mg  50 mg Oral QHS Lorretta Harp, MD   50 mg at 01/01/15 0229     Discharge Medications: Please see discharge summary for a list of discharge medications.  Relevant Imaging Results:  Relevant Lab  Results:   Additional Information SSN [id number]  Kelsey Copeland, [place]

## 2015-01-02 NOTE — Progress Notes (Signed)
STROKE TEAM PROGRESS NOTE   HISTORY Kelsey Copeland is a 69 y.o. female with a history of recent strokes who presents with worsening aphasia and confusion. She initially presented to [facility] for her second stroke in October which presented similarly to the events today where she was found to have a subacute infarct. Given that her symptoms started presumably after the stroke, there was concern possible seizure was involved and she had an EEG which was negative.  Tonight, her daughter found her on the ground using her left arm to "pick at" things that were around her. She is not sure about dystonic posturing of the right arm as she was standing behind her and did not take notice of it. She did not speak while she was picking at things, but subsequently was acting confused such as coming out of the bathroom without getting dressed. She was also slurring her speech and having significant difficulty with word finding which was something that was present immediately after her most recent stroke, but had improved greatly.  She had a similar episode of picking at things and not speaking yesterday, but did not have the same confusion afterwards that she has today.  Also of note, she has not been eating or drinking well recently.  She was followed by the neurohospitalists. EEG was negative, pt was started on Keppra  MRI with new occipital infarct. Transferred to the stroke service.   SUBJECTIVE (INTERVAL HISTORY) No family is at the bedside.  Overall she feels her condition is stable. She is sitting up in the chair at the bedside. She feels her speech is improving. More awake alert today. Follows all commands.   OBJECTIVE Temp:  [98 F (36.7 C)-98.2 F (36.8 C)] 98.2 F (36.8 C) (12/29 1004) Pulse Rate:  [76-85] 84 (12/29 1004) Cardiac Rhythm:  [-] Normal sinus rhythm (12/29 0700) Resp:  [17-20] 20 (12/29 1004) BP: (114-148)/(50-70) 131/67 mmHg (12/29 1004) SpO2:  [92 %-98 %] 92 % (12/29  1004)  CBC:  Recent Labs Lab 12/31/14 2205 12/31/14 2212  WBC  --  7.3  NEUTROABS  --  5.5  HGB 11.6* 11.1*  HCT 34.0* 33.4*  MCV  --  87.9  PLT  --  219    Basic Metabolic Panel:  Recent Labs Lab 12/31/14 2205 12/31/14 2212  NA 142 142  K 3.8 3.9  CL 111 110  CO2  --  20*  GLUCOSE 95 100*  BUN 14 13  CREATININE 1.20* 1.46*  CALCIUM  --  9.4    Lipid Panel:    Component Value Date/Time   CHOL 106 01/01/2015 0531   TRIG 104 01/01/2015 0531   HDL 34* 01/01/2015 0531   CHOLHDL 3.1 01/01/2015 0531   VLDL 21 01/01/2015 0531   LDLCALC 51 01/01/2015 0531   HgbA1c:  Lab Results  Component Value Date   HGBA1C 5.8* 01/01/2015   Urine Drug Screen:    Component Value Date/Time   LABOPIA NONE DETECTED 01/02/2015 0615   COCAINSCRNUR NONE DETECTED 01/02/2015 0615   LABBENZ NONE DETECTED 01/02/2015 0615   AMPHETMU NONE DETECTED 01/02/2015 0615   THCU NONE DETECTED 01/02/2015 0615   LABBARB NONE DETECTED 01/02/2015 0615      IMAGING I have personally reviewed the radiological images below and agree with the radiology interpretations.  Ct Head Wo Contrast 12/31/2014  No acute intracranial hemorrhage. 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   MRI HEAD 01/01/2015 : Motion degraded  examination. Subcentimeter focus of probable acute ischemia LEFT occipital lobe. Moderate to severe chronic small vessel ischemic disease. Moderate global brain atrophy. LEFT frontal lobe encephalomalacia may be posttraumatic or ischemic.   MRA HEAD 01/01/2015 : Moderately motion degraded examination without acute large vessel occlusion or definite high-grade stenosis.   CTA neck 10/10/14 Prominent calcified plaque in the cervical carotid arteries bilaterally resulting in 50% distal common carotid artery and 50% proximal internal carotid artery stenosis bilaterally.  MRI brain 10/07/14 1. Prominent anterior division left MCA territory  infarct without evidence for hemorrhage. T2 changes suggest a subacute time frame. 2. Extensive remote small vessel disease is present as well. 3. Minimal left maxillary sinus disease.  Carotid Doppler   A moderate amount of calcified plaque in both distal CCA extending into the ICA bilaterally. Right ICA 1-39% stenosis and left ICA 40-59% stenosis. Vertebral flow demonstrates antegrade flow.  EEG this is an abnormal awake and asleep EEG because of the above described findings. The presence of intermittent theta slowing over the left hemisphere may suggest focal cerebral dysfunction in that region. Left FIRDA could be indicative of focal structural lesion over the left frontal region or be part of an encephalopathic non specific process. No electrographic seizures noted. Clinical correlation is advised.   2D echo - pending  LE venous doppler - negative for DVT  TEE - pending  PHYSICAL EXAM  Temp:  [98 F (36.7 C)-98.6 F (37 C)] 98.6 F (37 C) (12/29 1402) Pulse Rate:  [71-85] 71 (12/29 1402) Resp:  [17-20] 20 (12/29 1402) BP: (114-148)/(50-73) 127/73 mmHg (12/29 1402) SpO2:  [92 %-98 %] 93 % (12/29 1402)  General - Well nourished, well developed, in no apparent distress.  Ophthalmologic - Fundi not visualized due to eye movement.  Cardiovascular - Regular rate and rhythm with no murmur.  Mental Status -  Level of arousal and orientation to year, place, and person were intact, but not orientated to month or age. Language exam showed mild  Hesitancy of speech, with occasional paraphasic errors, naming 2/4, able to repeat but slow and dysarthria.  Cranial Nerves II - XII - II - Visual field intact OU. III, IV, VI - Extraocular movements intact. V - Facial sensation decreased on the right. VII - right facial droop. VIII - Hearing & vestibular intact bilaterally. X - Palate elevates symmetrically, mild to moderate dysarthria. XI - Chin turning & shoulder shrug intact  bilaterally. XII - Tongue protrusion toward right.  Motor Strength - The patient's strength was RUE 4/5, RLE 5-/5, LUE and LLE 5/5.  Bulk was normal and fasciculations were absent.   Motor Tone - Muscle tone was assessed at the neck and appendages and was normal.  Reflexes - The patient's reflexes were 1+ in all extremities and she had no pathological reflexes.  Sensory - Light touch, temperature/pinprick were assessed and were symmetrical.    Coordination - The patient had normal movements in the hands with no ataxia or dysmetria.  Tremor was absent.  Gait and Station - not tested as pt prefers to have lunch.   ASSESSMENT/PLAN Ms. Kelsey Copeland is a 69 y.o. female with history of stroke in  Oct presenting with worsening aphasia and confusion. She did not receive IV t-PA.  Stroke: acute left occipital punctate infarct and recent left MCA cortical infarct, embolic secondary to unknown source.  MRI  Acute L occipital punctate infarct   MRA  No large vessel stenosis, fetal PCA on the left  Recent  CTA neck at [facility] - 50% distal CCA and 50% proximal ICA stenosis bilaterally.  Recent 2D at [facility] No intracardiac source of embolus identified.   Carotid doppler L 40-59% stenosis  2D Echo pending   TEE to look for embolic source. Arranged with [facility] for tomorrow.  If positive for PFO (patent foramen ovale), check bilateral lower extremity venous dopplers to rule out DVT as possible source of stroke. (I have made patient NPO after midnight tonight).  If TEE negative, a [facility] electrophysiologist will consult and consider placement of an implantable loop recorder to evaluate for atrial fibrillation as etiology of stroke. This has been explained to patient/family by Dr. Roda ShuttersXu and they are agreeable.   LDL 51  HgbA1c 5.8  Heparin 5000 units sq tid for VTE prophylaxis  Diet Heart Room service appropriate?: Yes; Fluid consistency::  Thin  aspirin 325 mg daily prior to admission, now on aspirin 325 mg daily. Continue ASA at discharge.  Patient counseled to be compliant with her antithrombotic medications  Ongoing aggressive stroke risk factor management.  Therapy recommendations:  HH PT and OT   Disposition:  pending (receiving HH PT and OT PTA; family has requested SNF placement at discharge)  Possible seizure  Presentation concerning for seizure and post ictal state  Started on keppra  EEG negative for seizure but left frontal FIRDA - may be postictal state  Continue keppra on discharge  Hypertension  Stable Permissive hypertension (OK if < 220/120) but gradually normalize in 5-7 days  Hyperlipidemia  Home meds:  lipitor 80, resumed in hospital  LDL 51, goal < 70  Continue statin at discharge  Other Stroke Risk Factors  Advanced age  Hx stroke/TIA  10/2014 ([facility]) anterior L MCA territory infarct, embolic.   08/2014 several small L hemispheric infarcts, fluctuating speech.  02/2014 TIA with R arm weakness 1-2 h.  Aspirin added to aggrenox. Off aggrenox d/t severe HA  2014 - similar episode to 10/2014. Seen in ED but not admitted. L ICA stenosis found. Had been on aspirin and plavix, stopped due to ulcers  Pt is a candidate for RESPECT ESUS trial   Other Active Problems  GERD  Chronic kidney disease stage III  depression  Hospital day # 2  Marvel PlanJindong Reana Chacko, MD PhD Stroke Neurology 01/02/2015 2:59 PM     To contact Stroke Continuity provider, please refer to [email or web address]. After hours, contact General Neurology

## 2015-01-02 NOTE — Progress Notes (Signed)
Preliminary results by tech - Venous Duplex Lower Ext. Completed. No evidence of deep or superficial vein thrombosis in both legs. Jontae Sonier, BS, RDMS, RVT  

## 2015-01-02 NOTE — Progress Notes (Signed)
PT Cancellation Note  Patient Details Name: Jeronimo GreavesGail D Mcmann MRN: [id number] DOB: 02-Feb-1945   Cancelled Treatment:    Reason Eval/Treat Not Completed: Other (comment). Attempted to see patient at 1500 and 1615, pt sleeping and difficult to awake both times. Notified RN and RN reports "she has these spells and appears obtunded sometimes but she is stable. Vitals are stable. She didn't sleep all night and was up all morning." At this time pt unable to participate in PT due to lethargy. PT to return as able.   Marcene BrawnChadwell, Taura Lamarre Marie 01/02/2015, 4:19 PM   Lewis ShockAshly Amber Guthridge, PT, DPT Pager #: [phone] Office #: [phone]

## 2015-01-02 NOTE — Consult Note (Signed)
ELECTROPHYSIOLOGY CONSULT NOTE  Patient ID: Kelsey Copeland MRN: [id number], DOB/AGE: 1945-10-24   Admit date: 12/31/2014 Date of Consult: 01/03/2015  Primary Physician: Abigail MiyamotoPERRY,LAWRENCE EDWARD, MD Primary [facility]ologist[facility] Reason for Consultation: Cryptogenic stroke; recommendations regarding Implantable Loop Recorder  History of Present Illness: Kelsey Copeland was admitted on 12/31/2014 with aphasia and confusion.  Her daughter found her on the ground the day of admission with speech disturbances and confusion. Imaging demonstrated acute left occipital punctate infarct.  She has had previous strokes all felt to be embolic in nature. Carotid dopplers demonstrated left 40-59% left ICA stenosis. Neurology does not feel that this is responsible for her stroke.  The patient has been monitored on telemetry which has demonstrated sinus rhythm with no arrhythmias.  Inpatient stroke work-up is to be completed with a TEE.   Echocardiogram this admission demonstrated EF 60-65%, no RWMA, LA 34.     Prior to admission, the patient denies chest pain, shortness of breath, dizziness, palpitations, or syncope.   EP has been asked to evaluate for placement of an implantable loop recorder to monitor for atrial fibrillation.   Past Medical History  Diagnosis Date  . Allergy   . Depression   . GERD (gastroesophageal reflux disease)   . Hyperlipidemia   . Hypertension   . Stroke (HCC)   . Arthritis   . COPD (chronic obstructive pulmonary disease) (HCC)   . CKD (chronic kidney disease), stage III      Surgical History:  Past Surgical History  Procedure Laterality Date  . Tubal ligation       Prescriptions prior to admission  Medication Sig Dispense Refill Last Dose  . amLODipine (NORVASC) 10 MG tablet Take 10 mg by mouth daily.   12/31/2014 at Unknown time  . aspirin 325 MG tablet Take 325 mg by mouth daily.   12/31/2014 at Unknown time  . atorvastatin (LIPITOR) 80 MG tablet  Take 80 mg by mouth daily.   12/31/2014 at Unknown time  . ezetimibe (ZETIA) 10 MG tablet Take 10 mg by mouth daily.   12/31/2014 at Unknown time  . folic acid (FOLVITE) 1 MG tablet Take 1 mg by mouth daily.   12/31/2014 at Unknown time  . metoprolol tartrate (LOPRESSOR) 25 MG tablet Take 25 mg by mouth daily.    12/31/2014 at 1000  . omeprazole (PRILOSEC) 40 MG capsule Take 40 mg by mouth daily.   12/31/2014 at Unknown time  . sertraline (ZOLOFT) 100 MG tablet Take 200 mg by mouth daily.    12/31/2014 at Unknown time  . traZODone (DESYREL) 50 MG tablet Take 50 mg by mouth at bedtime.   12/30/2014 at Unknown time    Inpatient Medications:  .  stroke: mapping our early stages of recovery book   Does not apply Once  . amLODipine  10 mg Oral Daily  . aspirin  325 mg Oral Daily  . atorvastatin  80 mg Oral q1800  . ezetimibe  10 mg Oral Daily  . folic acid  1 mg Oral Daily  . heparin  5,000 Units Subcutaneous 3 times per day  . levETIRAcetam  500 mg Intravenous Q12H  . metoprolol tartrate  25 mg Oral Daily  . pantoprazole  40 mg Oral Daily  . sertraline  200 mg Oral Daily  . traZODone  50 mg Oral QHS    Allergies:  Allergies  Allergen Reactions  . Plavix [Clopidogrel Bisulfate] Anaphylaxis    Bleeding also  .  Amoxicillin Other (See Comments)  . Ciprofloxacin Other (See Comments)    Causes muscle aches.  . Dipyrone Other (See Comments)    headaches  . Acyclovir And Related Other (See Comments)  . Celebrex [Celecoxib] Other (See Comments)  . Penicillins Rash    Social History   Social History  . Marital Status: Unknown    Spouse Name: N/A  . Number of Children: N/A  . Years of Education: N/A   Occupational History  . Not on file.   Social History Main Topics  . Smoking status: Never Smoker   . Smokeless tobacco: Not on file  . Alcohol Use: No  . Drug Use: No  . Sexual Activity: Not on file   Other Topics Concern  . Not on file   Social History Narrative      Family History: no premature CAD   Review of Systems: All other systems reviewed and are otherwise negative except as noted above.  Physical Exam: Filed Vitals:   01/02/15 1738 01/02/15 2111 01/03/15 0114 01/03/15 0507  BP: 131/71 128/66 128/64 133/65  Pulse: 74 80 77 80  Temp: 97.8 F (36.6 C) 98.1 F (36.7 C) 97.8 F (36.6 C) 98.1 F (36.7 C)  TempSrc: Oral Oral Oral Oral  Resp: Height:      Weight:      SpO2: 94% 94% 94% 94%    GEN- The patient is elderly appearing, alert and oriented x 3 today.   Head- normocephalic, atraumatic Eyes-  Sclera clear, conjunctiva pink Ears- hearing intact Oropharynx- clear Neck- supple Lungs- Clear to ausculation bilaterally, normal work of breathing Heart- Regular rate and rhythm, no murmurs, rubs or gallops  GI- soft, NT, ND, + BS Extremities- no clubbing, cyanosis, or edema MS- no significant deformity or atrophy Skin- no rash or lesion Psych- euthymic mood, full affect   Labs:   Lab Results  Component Value Date   WBC 7.3 12/31/2014   HGB 11.1* 12/31/2014   HCT 33.4* 12/31/2014   MCV 87.9 12/31/2014   PLT 219 12/31/2014     Recent Labs Lab 12/31/14 2212  NA 142  K 3.9  CL 110  CO2 20*  BUN 13  CREATININE 1.46*  CALCIUM 9.4  PROT 6.4*  BILITOT 0.6  ALKPHOS 81  ALT 21  AST 30  GLUCOSE 100*     Radiology/Studies: Ct Head Wo Contrast 12/31/2014  CLINICAL DATA:  69 year old female with difficulty speaking and walking presenting with confusion and slurred speech. EXAM: CT HEAD WITHOUT CONTRAST TECHNIQUE: Contiguous axial images were obtained from the base of the skull through the vertex without intravenous contrast. COMPARISON:  None. FINDINGS: There is dilatation of the ventricles out of proportion with the sulci which may represent central volume loss versus normal pressure hydrocephalus. Clinical correlation is recommended. Periventricular and deep white matter hypodensities represent chronic  microvascular ischemic changes. Small left frontal lobe cortical/subcortical old-appearing infarct and encephalomalacia. There is no intracranial hemorrhage. No mass effect or midline shift identified. The visualized paranasal sinuses and mastoid air cells are well aerated. The calvarium is intact. IMPRESSION: No acute intracranial hemorrhage. 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 If symptoms persist and there are no contraindications, MRI may provide better evaluation if clinically indicated. These results were called by telephone at the time of interpretation on 12/31/2014 at 10:40 pm to Dr. Eber Hong , who verbally acknowledged these results. Electronically Signed   By: Elgie Collard  M.Copeland.   On: 12/31/2014 22:47   Mr Brain Wo Contrast 01/01/2015  CLINICAL DATA:  Aphasia. Evaluate stroke. History of chronic kidney disease, hyperlipidemia and hypertension. EXAM: MRI HEAD WITHOUT CONTRAST MRA HEAD WITHOUT CONTRAST TECHNIQUE: Multiplanar, multiecho pulse sequences of the brain and surrounding structures were obtained without intravenous contrast. Angiographic images of the head were obtained using MRA technique without contrast. COMPARISON:  CT head December 31, 2014 FINDINGS: MRI HEAD FINDINGS Multiple sequences are mildly or moderately motion degraded. Subcentimeter focus of reduced diffusion LEFT occipital lobe cortex. Due to small size, limited assessment on ADC map. Moderate ventriculomegaly on the basis of global parenchymal brain volume loss. Small area LEFT frontal encephalomalacia. Patchy supratentorial and confluent pontine white matter T2 hyperintensities without midline shift, mass effect or mass lesions. No abnormal extra-axial fluid collections. Dural calcifications along anterior falx. Ocular globes and orbital contents are unremarkable though not tailored for evaluation. No abnormal sellar expansion. No suspicious calvarial bone marrow  signal. Craniocervical junction maintained. Visualized paranasal sinuses and mastoid air cells are well-aerated. MRA HEAD FINDINGS Moderately motion degraded examination. Anterior circulation: Normal flow related enhancement of the included cervical, petrous, cavernous and supraclinoid internal carotid arteries. Patent anterior communicating artery. Normal flow related enhancement of the anterior and middle cerebral arteries, including distal segments. LEFT A1 segment is developmentally diminutive. Supernumerary anterior cerebral artery arising from from A-comm artery. No large vessel occlusion, high-grade stenosis, definite aneurysm. Posterior circulation: Codominant vertebral arteries. Basilar artery is patent, with normal flow related enhancement of the main branch vessels. Normal flow related enhancement of the posterior cerebral arteries. Fetal origin LEFT posterior cerebral artery. No large vessel occlusion, high-grade stenosis, definite aneurysm. IMPRESSION: MRI HEAD: Motion degraded examination. Subcentimeter focus of probable acute ischemia LEFT occipital lobe. Moderate to severe chronic small vessel ischemic disease. Moderate global brain atrophy. LEFT frontal lobe encephalomalacia may be posttraumatic or ischemic. MRA HEAD: Moderately motion degraded examination without acute large vessel occlusion or definite high-grade stenosis. Electronically Signed   By: Courtnay  Bloomer M.Copeland.   On: 01/01/2015 01:55   12-lead ECG sinus rhythm, rate 87, normal intervals All prior EKG's in EPIC reviewed with no documented atrial fibrillation  Telemetry sinus rhythm  Assessment and Plan:  1. Cryptogenic stroke The patient presents with cryptogenic stroke.  The patient has a TEE planned for this AM.  I spoke at length with the patient about monitoring for afib with an implantable loop recorder.  Risks, benefits, and alteratives to implantable loop recorder were discussed with the patient today.   At this time,  the patient is very clear in their decision to proceed with implantable loop recorder.   Wound care was reviewed with the patient (keep incision clean and dry for 3 days).     Please call with questions.   Seiler,Amber K, NP 01/03/2015 8:14 AM   I have seen, examined the patient, and reviewed the above assessment and plan.  On exam, frail and elderly appearing.  Changes to above are made where necessary.   Risks reviewed by phone by Amber Seiler NP with daughter in law by phone who wishes for us to proceed.  Pt also in agreement.  Co Sign: Tamar Miano, MD 01/03/2015 8:34 AM   

## 2015-01-02 NOTE — Progress Notes (Signed)
    [facility] has been requested to perform a transesophageal echocardiogram on Kelsey Copeland  for acute cerebrovascular accident of the left occipital lobe.  After careful review of history and examination, the risks and benefits of transesophageal echocardiogram have been explained including risks of esophageal damage, perforation (1:10,000 risk), bleeding, pharyngeal hematoma as well as other potential complications associated with conscious sedation including aspiration, arrhythmia, respiratory failure and death. Alternatives to treatment were discussed, questions were answered.   The patient has baseline dementia and with her decreased mental status she did not seem to be comprehending what I was telling her about the procedure. She kept closing her eyes and would nod occasionally. I contacted her daughter and POA, Perley JainSandra Copeland to obtain consent and discuss the procedure. The above information was discussed and she gave verbal consent to proceed. She also voiced verbal consent to Jeannett SeniorStephen, the patient's RN.   BP has been 114/50 - 148/73 in the past 24 hours with no requirement for pressors. Hgb at 11.1. Platelets at 219.  Signed, Ellsworth LennoxBrittany M Strader, PA-C 01/02/2015 4:25 PM

## 2015-01-02 NOTE — Progress Notes (Signed)
Preliminary results by tech - Carotid Duplex Completed. A moderate amount of calcified plaque in both distal CCA extending into the ICA bilaterally. Right ICA 1-39% stenosis and left ICA 40-59% stenosis. Vertebral flow demonstrates antegrade flow. Marilynne Halstedita Gemma Ruan, BS, RDMS, RVT

## 2015-01-02 NOTE — Progress Notes (Signed)
Occupational Therapy Evaluation Patient Details Name: Kelsey Copeland MRN: [id number] DOB: 1945-08-14 Today's Date: 01/02/2015    History of Present Illness 69 y.o. female with PMH of hypertension, hyperlipidemia, stroke, arthritis, chronic kidney disease-stage III, GERD, depression, who presents with worsening aphasia and confusion.  MRI shows L occipital lobe possible acute ischemia.   Clinical Impression   PTA, pt was independent with ADLs and used a RW for mobility - daughter assists with IADL according to pt. Pt currently presents with generalized weakness and expressive aphasia and required min guard assist for all ADLs for safety. Pt will benefit from acute skilled OT to increase independence and safety with ADLs and functional mobility to allow for safe discharge home. Recommending HHOT for ADL retraining and fall risk evaluation.     Follow Up Recommendations  Home health OT;Supervision/Assistance - 24 hour    Equipment Recommendations  None recommended by OT    Recommendations for Other Services       Precautions / Restrictions Precautions Precautions: Fall Restrictions Weight Bearing Restrictions: No      Mobility Bed Mobility Overal bed mobility: Needs Assistance Bed Mobility: Supine to Sit     Supine to sit: Min guard     General bed mobility comments: HOB flat, heavy use of bedrail. No physical assist required.   Transfers Overall transfer level: Needs assistance Equipment used: Rolling walker (2 wheeled) Transfers: Sit to/from Stand Sit to Stand: Min guard         General transfer comment: Min guard for safety - no physical assist needed. Verbal cues for safe hand placement on seated surfaces    Balance Overall balance assessment: Needs assistance Sitting-balance support: No upper extremity supported;Feet supported Sitting balance-Leahy Scale: Fair Sitting balance - Comments: Able to sit ~10 mins with no UE support   Standing balance support:  No upper extremity supported;During functional activity Standing balance-Leahy Scale: Fair Standing balance comment: Able to stand to complete grooming tasks at sink for ~3 minutes with no UE support                            ADL Overall ADL's : Needs assistance/impaired Eating/Feeding: Modified independent;Sitting   Grooming: Wash/dry hands;Min guard;Standing                   Toilet Transfer: Min guard;Cueing for safety;BSC;Grab bars;RW   Toileting- Architect and Hygiene: Min guard;Sit to/from stand       Functional mobility during ADLs: Min guard;Rolling walker General ADL Comments: Min guard due to weakness - pt moves slowly and carefully     Vision Vision Assessment?: Yes Eye Alignment: Within Functional Limits Ocular Range of Motion: Within Functional Limits Alignment/Gaze Preference: Within Defined Limits Tracking/Visual Pursuits: Decreased smoothness of horizontal tracking;Decreased smoothness of vertical tracking;Requires cues, head turns, or add eye shifts to track (Needs cues and adds extra head turns) Saccades: Decreased speed of saccadic movement Convergence: Within functional limits Visual Fields: Other (comment) (L/R peripheral visual field deficits) Additional Comments: ~40 degree peripheral field deficits on R and L sides   Perception     Praxis      Pertinent Vitals/Pain Pain Assessment: No/denies pain     Hand Dominance Right   Extremity/Trunk Assessment Upper Extremity Assessment Upper Extremity Assessment: RUE deficits/detail;Generalized weakness RUE Deficits / Details: weak grip strength and decreased fine motor coordination (residual from previous CVA - slightly more weak per pt), STRENGTH: wfl; AROM: wfl; SENSATION: intact  RUE Coordination: decreased fine motor   Lower Extremity Assessment Lower Extremity Assessment: Defer to PT evaluation   Cervical / Trunk Assessment Cervical / Trunk Assessment: Kyphotic    Communication Communication Communication: Expressive difficulties   Cognition Arousal/Alertness: Awake/alert Behavior During Therapy: WFL for tasks assessed/performed Overall Cognitive Status: Within Functional Limits for tasks assessed                     General Comments       Exercises       Shoulder Instructions      Home Living Family/patient expects to be discharged to:: Private residence Living Arrangements: Children Available Help at Discharge: Available PRN/intermittently (Daughter and son?) Type of Home: House Home Access: Stairs to enter   Entrance Stairs-Rails: Can reach both;Left;Right Home Layout: Able to live on main level with bedroom/bathroom     Bathroom Shower/Tub: Walk-in Pensions consultantshower;Curtain   Bathroom Toilet: Standard     Home Equipment: Environmental consultantWalker - 2 wheels;Shower seat - built in;Grab bars - toilet;Grab bars - tub/shower;Hand held shower head;Adaptive equipment;Wheelchair - Equities tradermanual Adaptive Equipment: Reacher    Lives With: Family    Prior Functioning/Environment Level of Independence: Needs assistance  Gait / Transfers Assistance Needed: RW sometimes ADL's / Homemaking Assistance Needed: independent with ADLs, daughter helps with IADLs.        OT Diagnosis: Generalized weakness;Other (comment) (expressive aphasia)   OT Problem List: Decreased strength;Decreased activity tolerance;Impaired balance (sitting and/or standing);Decreased coordination;Decreased safety awareness;Decreased knowledge of use of DME or AE;Impaired UE functional use   OT Treatment/Interventions: Self-care/ADL training;Therapeutic exercise;Energy conservation;DME and/or AE instruction;Therapeutic activities;Patient/family education;Balance training    OT Goals(Current goals can be found in the care plan section) Acute Rehab OT Goals Patient Stated Goal: to go home soon OT Goal Formulation: With patient Time For Goal Achievement: 01/16/15 Potential to Achieve Goals:  Good ADL Goals Pt Will Perform Upper Body Bathing: with modified independence;sitting Pt Will Perform Lower Body Bathing: with modified independence;sit to/from stand Pt Will Transfer to Toilet: with modified independence;regular height toilet;ambulating;grab bars Pt Will Perform Toileting - Clothing Manipulation and hygiene: with modified independence;sit to/from stand Pt Will Perform Tub/Shower Transfer: Shower transfer;ambulating;shower seat;grab bars;rolling walker  OT Frequency: Min 2X/week   Barriers to D/C: Decreased caregiver support  Home alone during the day according to pt       Co-evaluation              End of Session Equipment Utilized During Treatment: Gait belt;Rolling walker Nurse Communication: Mobility status  Activity Tolerance: Patient tolerated treatment well Patient left: in chair;with call bell/phone within reach;with chair alarm set   Time: [id number] OT Time Calculation (min): 40 min Charges:  OT General Charges $OT Visit: 1 Procedure OT Evaluation $Initial OT Evaluation Tier I: 1 Procedure OT Treatments $Self Care/Home Management : 23-37 mins G-Codes:    Nils PyleJulia Delvecchio Madole, OTR/L 01/02/2015, 12:39 PM Pager: [phone]

## 2015-01-03 ENCOUNTER — Encounter (HOSPITAL_COMMUNITY): Payer: Self-pay | Admitting: *Deleted

## 2015-01-03 ENCOUNTER — Encounter (HOSPITAL_COMMUNITY)
Admission: EM | Disposition: A | Discharge status: Skilled Nursing Facility | Payer: Self-pay | Source: Home / Self Care | Attending: Internal Medicine

## 2015-01-03 ENCOUNTER — Inpatient Hospital Stay (HOSPITAL_COMMUNITY): Payer: Commercial Managed Care - HMO

## 2015-01-03 DIAGNOSIS — I639 Cerebral infarction, unspecified: Secondary | ICD-10-CM

## 2015-01-03 DIAGNOSIS — I638 Other cerebral infarction: Secondary | ICD-10-CM

## 2015-01-03 HISTORY — PX: EP IMPLANTABLE DEVICE: SHX172B

## 2015-01-03 HISTORY — PX: TEE WITHOUT CARDIOVERSION: SHX5443

## 2015-01-03 SURGERY — ECHOCARDIOGRAM, TRANSESOPHAGEAL
Anesthesia: Moderate Sedation

## 2015-01-03 SURGERY — LOOP RECORDER INSERTION
Anesthesia: LOCAL

## 2015-01-03 MED ORDER — SODIUM CHLORIDE 0.9 % IV SOLN: INTRAVENOUS | Status: DC

## 2015-01-03 MED ORDER — FENTANYL CITRATE (PF) 100 MCG/2ML IJ SOLN
INTRAMUSCULAR | Status: DC | PRN
Start: 2015-01-03 — End: 2015-01-03
  Administered 2015-01-03: 25 ug via INTRAVENOUS

## 2015-01-03 MED ORDER — LIDOCAINE-EPINEPHRINE 1 %-1:100000 IJ SOLN
INTRAMUSCULAR | Status: DC | PRN
  Administered 2015-01-03: 20 mL

## 2015-01-03 MED ORDER — MIDAZOLAM HCL 5 MG/ML IJ SOLN
INTRAMUSCULAR | Status: AC
  Filled 2015-01-03: qty 2

## 2015-01-03 MED ORDER — LEVETIRACETAM 500 MG PO TABS: 500.0000 mg | ORAL_TABLET | Freq: Two times a day (BID) | ORAL | Status: AC

## 2015-01-03 MED ORDER — FENTANYL CITRATE (PF) 100 MCG/2ML IJ SOLN
INTRAMUSCULAR | Status: AC
  Filled 2015-01-03: qty 2

## 2015-01-03 MED ORDER — MIDAZOLAM HCL 10 MG/2ML IJ SOLN
INTRAMUSCULAR | Status: DC | PRN
  Administered 2015-01-03 (×2): 2 mg via INTRAVENOUS

## 2015-01-03 MED ORDER — BUTAMBEN-TETRACAINE-BENZOCAINE 2-2-14 % EX AERO
INHALATION_SPRAY | CUTANEOUS | Status: DC | PRN
  Administered 2015-01-03: 2 via TOPICAL

## 2015-01-03 MED ORDER — DIPHENHYDRAMINE HCL 50 MG/ML IJ SOLN
INTRAMUSCULAR | Status: AC
  Filled 2015-01-03: qty 1

## 2015-01-03 MED ORDER — LIDOCAINE-EPINEPHRINE 1 %-1:100000 IJ SOLN
INTRAMUSCULAR | Status: AC
  Filled 2015-01-03: qty 1

## 2015-01-03 SURGICAL SUPPLY — 2 items
LOOP REVEAL LINQSYS (Prosthesis & Implant Heart) ×2 IMPLANT
PACK LOOP INSERTION (CUSTOM PROCEDURE TRAY) ×2 IMPLANT

## 2015-01-03 NOTE — Care Management Note (Signed)
Case Management Note  Patient Details  Name: Kelsey GreavesGail D Copeland MRN: [id number] Date of Birth: 1945-07-09  Subjective/Objective:                    Action/Plan: [facility]  CM spoke with family, who confirmed that patient will be discharging to SNF for short-term rehab.  CSW confirmed discharge plan.  No additional discharge needs identified at this time.  Expected Discharge Date:                  Expected Discharge Plan:  Skilled Nursing Facility  In-House Referral:  Clinical Social Work  Discharge planning Services     Post Acute Care Choice:    Choice offered to:     DME Arranged:    DME Agency:     HH Arranged:    HH Agency:     Status of Service:  Completed, signed off  Medicare Important Message Given:    Date Medicare IM Given:    Medicare IM give by:    Date Additional Medicare IM Given:    Additional Medicare Important Message give by:     If discussed at Long Length of Stay Meetings, dates discussed:    Additional CommentsAnda Kraft:  [name], RN 01/03/2015, 11:49 AM [phone]

## 2015-01-03 NOTE — Consult Note (Signed)
   [facility]   01/03/2015  Jeronimo GreavesGail D Ellenbecker 1946/01/01 [id number] [facility].  Patient was [facility] for [facility] as a benefit of her [employer] Medicare.  Chart review reveals that the patient will likely be discharged to a skilled nursing facility for ongoing rehab needs.  No [facility] care management needs at this time noted.  For questions, please contact: Charlesetta ShanksVictoria Cayetano Mikita, RN BSN CCM [facility]ia[facility]  [phone] business mobile phone

## 2015-01-03 NOTE — Clinical Social Work Note (Signed)
[facility] [place].  Patient and family agreeable to plans will transport via ems RN to call report to [phone].   Windell MouldingEric Cinthya Bors, MSW, Theresia MajorsLCSWA [phone]

## 2015-01-03 NOTE — H&P (View-Only) (Signed)
ELECTROPHYSIOLOGY CONSULT NOTE  Patient ID: Jeronimo GreavesGail D Klatt MRN: [id number], DOB/AGE: 1945-10-24   Admit date: 12/31/2014 Date of Consult: 01/03/2015  Primary Physician: Abigail MiyamotoPERRY,LAWRENCE EDWARD, MD Primary Cardiologist: [facility] Reason for Consultation: Cryptogenic stroke; recommendations regarding Implantable Loop Recorder  History of Present Illness: Jeronimo GreavesGail D Collings was admitted on 12/31/2014 with aphasia and confusion.  Her daughter found her on the ground the day of admission with speech disturbances and confusion. Imaging demonstrated acute left occipital punctate infarct.  She has had previous strokes all felt to be embolic in nature. Carotid dopplers demonstrated left 40-59% left ICA stenosis. Neurology does not feel that this is responsible for her stroke.  The patient has been monitored on telemetry which has demonstrated sinus rhythm with no arrhythmias.  Inpatient stroke work-up is to be completed with a TEE.   Echocardiogram this admission demonstrated EF 60-65%, no RWMA, LA 34.     Prior to admission, the patient denies chest pain, shortness of breath, dizziness, palpitations, or syncope.   EP has been asked to evaluate for placement of an implantable loop recorder to monitor for atrial fibrillation.   Past Medical History  Diagnosis Date  . Allergy   . Depression   . GERD (gastroesophageal reflux disease)   . Hyperlipidemia   . Hypertension   . Stroke (HCC)   . Arthritis   . COPD (chronic obstructive pulmonary disease) (HCC)   . CKD (chronic kidney disease), stage III      Surgical History:  Past Surgical History  Procedure Laterality Date  . Tubal ligation       Prescriptions prior to admission  Medication Sig Dispense Refill Last Dose  . amLODipine (NORVASC) 10 MG tablet Take 10 mg by mouth daily.   12/31/2014 at Unknown time  . aspirin 325 MG tablet Take 325 mg by mouth daily.   12/31/2014 at Unknown time  . atorvastatin (LIPITOR) 80 MG tablet  Take 80 mg by mouth daily.   12/31/2014 at Unknown time  . ezetimibe (ZETIA) 10 MG tablet Take 10 mg by mouth daily.   12/31/2014 at Unknown time  . folic acid (FOLVITE) 1 MG tablet Take 1 mg by mouth daily.   12/31/2014 at Unknown time  . metoprolol tartrate (LOPRESSOR) 25 MG tablet Take 25 mg by mouth daily.    12/31/2014 at 1000  . omeprazole (PRILOSEC) 40 MG capsule Take 40 mg by mouth daily.   12/31/2014 at Unknown time  . sertraline (ZOLOFT) 100 MG tablet Take 200 mg by mouth daily.    12/31/2014 at Unknown time  . traZODone (DESYREL) 50 MG tablet Take 50 mg by mouth at bedtime.   12/30/2014 at Unknown time    Inpatient Medications:  .  stroke: mapping our early stages of recovery book   Does not apply Once  . amLODipine  10 mg Oral Daily  . aspirin  325 mg Oral Daily  . atorvastatin  80 mg Oral q1800  . ezetimibe  10 mg Oral Daily  . folic acid  1 mg Oral Daily  . heparin  5,000 Units Subcutaneous 3 times per day  . levETIRAcetam  500 mg Intravenous Q12H  . metoprolol tartrate  25 mg Oral Daily  . pantoprazole  40 mg Oral Daily  . sertraline  200 mg Oral Daily  . traZODone  50 mg Oral QHS    Allergies:  Allergies  Allergen Reactions  . Plavix [Clopidogrel Bisulfate] Anaphylaxis    Bleeding also  .  Amoxicillin Other (See Comments)  . Ciprofloxacin Other (See Comments)    Causes muscle aches.  . Dipyrone Other (See Comments)    headaches  . Acyclovir And Related Other (See Comments)  . Celebrex [Celecoxib] Other (See Comments)  . Penicillins Rash    Social History   Social History  . Marital Status: Unknown    Spouse Name: N/A  . Number of Children: N/A  . Years of Education: N/A   Occupational History  . Not on file.   Social History Main Topics  . Smoking status: Never Smoker   . Smokeless tobacco: Not on file  . Alcohol Use: No  . Drug Use: No  . Sexual Activity: Not on file   Other Topics Concern  . Not on file   Social History Narrative      Family History: no premature CAD   Review of Systems: All other systems reviewed and are otherwise negative except as noted above.  Physical Exam: Filed Vitals:   01/02/15 1738 01/02/15 2111 01/03/15 0114 01/03/15 0507  BP: 131/71 128/66 128/64 133/65  Pulse: 74 80 77 80  Temp: 97.8 F (36.6 C) 98.1 F (36.7 C) 97.8 F (36.6 C) 98.1 F (36.7 C)  TempSrc: Oral Oral Oral Oral  Resp: Height:      Weight:      SpO2: 94% 94% 94% 94%    GEN- The patient is elderly appearing, alert and oriented x 3 today.   Head- normocephalic, atraumatic Eyes-  Sclera clear, conjunctiva pink Ears- hearing intact Oropharynx- clear Neck- supple Lungs- Clear to ausculation bilaterally, normal work of breathing Heart- Regular rate and rhythm, no murmurs, rubs or gallops  GI- soft, NT, ND, + BS Extremities- no clubbing, cyanosis, or edema MS- no significant deformity or atrophy Skin- no rash or lesion Psych- euthymic mood, full affect   Labs:   Lab Results  Component Value Date   WBC 7.3 12/31/2014   HGB 11.1* 12/31/2014   HCT 33.4* 12/31/2014   MCV 87.9 12/31/2014   PLT 219 12/31/2014     Recent Labs Lab 12/31/14 2212  NA 142  K 3.9  CL 110  CO2 20*  BUN 13  CREATININE 1.46*  CALCIUM 9.4  PROT 6.4*  BILITOT 0.6  ALKPHOS 81  ALT 21  AST 30  GLUCOSE 100*     Radiology/Studies: Ct Head Wo Contrast 12/31/2014  CLINICAL DATA:  69 year old female with difficulty speaking and walking presenting with confusion and slurred speech. EXAM: CT HEAD WITHOUT CONTRAST TECHNIQUE: Contiguous axial images were obtained from the base of the skull through the vertex without intravenous contrast. COMPARISON:  None. FINDINGS: There is dilatation of the ventricles out of proportion with the sulci which may represent central volume loss versus normal pressure hydrocephalus. Clinical correlation is recommended. Periventricular and deep white matter hypodensities represent chronic  microvascular ischemic changes. Small left frontal lobe cortical/subcortical old-appearing infarct and encephalomalacia. There is no intracranial hemorrhage. No mass effect or midline shift identified. The visualized paranasal sinuses and mastoid air cells are well aerated. The calvarium is intact. IMPRESSION: No acute intracranial hemorrhage. 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 If symptoms persist and there are no contraindications, MRI may provide better evaluation if clinically indicated. These results were called by telephone at the time of interpretation on 12/31/2014 at 10:40 pm to Dr. Eber Hong , who verbally acknowledged these results. Electronically Signed   By: Elgie Collard  M.D.   On: 12/31/2014 22:47   Mr Brain Wo Contrast 01/01/2015  CLINICAL DATA:  Aphasia. Evaluate stroke. History of chronic kidney disease, hyperlipidemia and hypertension. EXAM: MRI HEAD WITHOUT CONTRAST MRA HEAD WITHOUT CONTRAST TECHNIQUE: Multiplanar, multiecho pulse sequences of the brain and surrounding structures were obtained without intravenous contrast. Angiographic images of the head were obtained using MRA technique without contrast. COMPARISON:  CT head December 31, 2014 FINDINGS: MRI HEAD FINDINGS Multiple sequences are mildly or moderately motion degraded. Subcentimeter focus of reduced diffusion LEFT occipital lobe cortex. Due to small size, limited assessment on ADC map. Moderate ventriculomegaly on the basis of global parenchymal brain volume loss. Small area LEFT frontal encephalomalacia. Patchy supratentorial and confluent pontine white matter T2 hyperintensities without midline shift, mass effect or mass lesions. No abnormal extra-axial fluid collections. Dural calcifications along anterior falx. Ocular globes and orbital contents are unremarkable though not tailored for evaluation. No abnormal sellar expansion. No suspicious calvarial bone marrow  signal. Craniocervical junction maintained. Visualized paranasal sinuses and mastoid air cells are well-aerated. MRA HEAD FINDINGS Moderately motion degraded examination. Anterior circulation: Normal flow related enhancement of the included cervical, petrous, cavernous and supraclinoid internal carotid arteries. Patent anterior communicating artery. Normal flow related enhancement of the anterior and middle cerebral arteries, including distal segments. LEFT A1 segment is developmentally diminutive. Supernumerary anterior cerebral artery arising from from A-comm artery. No large vessel occlusion, high-grade stenosis, definite aneurysm. Posterior circulation: Codominant vertebral arteries. Basilar artery is patent, with normal flow related enhancement of the main branch vessels. Normal flow related enhancement of the posterior cerebral arteries. Fetal origin LEFT posterior cerebral artery. No large vessel occlusion, high-grade stenosis, definite aneurysm. IMPRESSION: MRI HEAD: Motion degraded examination. Subcentimeter focus of probable acute ischemia LEFT occipital lobe. Moderate to severe chronic small vessel ischemic disease. Moderate global brain atrophy. LEFT frontal lobe encephalomalacia may be posttraumatic or ischemic. MRA HEAD: Moderately motion degraded examination without acute large vessel occlusion or definite high-grade stenosis. Electronically Signed   By: Awilda Metro M.D.   On: 01/01/2015 01:55   12-lead ECG sinus rhythm, rate 87, normal intervals All prior EKG's in EPIC reviewed with no documented atrial fibrillation  Telemetry sinus rhythm  Assessment and Plan:  1. Cryptogenic stroke The patient presents with cryptogenic stroke.  The patient has a TEE planned for this AM.  I spoke at length with the patient about monitoring for afib with an implantable loop recorder.  Risks, benefits, and alteratives to implantable loop recorder were discussed with the patient today.   At this time,  the patient is very clear in their decision to proceed with implantable loop recorder.   Wound care was reviewed with the patient (keep incision clean and dry for 3 days).     Please call with questions.   Marily Lente, NP 01/03/2015 8:14 AM   I have seen, examined the patient, and reviewed the above assessment and plan.  On exam, frail and elderly appearing.  Changes to above are made where necessary.   Risks reviewed by phone by Gypsy Balsam NP with daughter in law by phone who wishes for [place] to proceed.  Pt also in agreement.  Co Sign: Hillis Range, MD 01/03/2015 8:34 AM

## 2015-01-03 NOTE — Progress Notes (Signed)
Physical Therapy Treatment Patient Details Name: Kelsey Copeland MRN: [id number] DOB: [birth year] Today's Date: 01/03/2015    History of Present Illness 69 y.o. female with PMH of hypertension, hyperlipidemia, stroke, arthritis, chronic kidney disease-stage III, GERD, depression, who presents with worsening aphasia and confusion.  MRI shows L occipital lobe possible acute ischemia.    PT Comments    Patient progressing with mobility. Continues with delayed responses (motorically and verbally). Patient continues to require education regarding safe and proper use of DME.    Follow Up Recommendations  SNF     Equipment Recommendations  None recommended by PT    Recommendations for Other Services       Precautions / Restrictions Precautions Precautions: Fall Restrictions Weight Bearing Restrictions: No    Mobility  Bed Mobility Overal bed mobility: Needs Assistance Bed Mobility: Rolling;Sidelying to Sit Rolling: Supervision Sidelying to sit: Min assist       General bed mobility comments: HOB flat, no bedrail. assist to right trunk; incr time to scoot to EOB   Transfers Overall transfer level: Needs assistance Equipment used: Rolling walker (2 wheeled) Transfers: Sit to/from Stand Sit to Stand: Min guard         General transfer comment: Min guard for safety - no physical assist needed. Verbal cues for safe hand placement on seated surfaces  Ambulation/Gait Ambulation/Gait assistance: Min assist Ambulation Distance (Feet): 75 Feet Assistive device: Rolling walker (2 wheeled) Gait Pattern/deviations: Step-through pattern;Decreased stride length;Trunk flexed   Gait velocity interpretation: Below normal speed for age/gender     Stairs            Wheelchair Mobility    Modified Rankin (Stroke Patients Only) Modified Rankin (Stroke Patients Only) Pre-Morbid Rankin Score: Moderately severe disability Modified Rankin: Moderately severe disability      Balance Overall balance assessment: Needs assistance Sitting-balance support: No upper extremity supported;Feet supported Sitting balance-Leahy Scale: Fair       Standing balance-Leahy Scale: Fair                      Cognition Arousal/Alertness: Awake/alert Behavior During Therapy: WFL for tasks assessed/performed Overall Cognitive Status: Within Functional Limits for tasks assessed                      Exercises      General Comments General comments (skin integrity, edema, etc.): Pt had been incontinent of urine prior to PT arrival and when asked if she or linens were wet, she was unaware. Performed pericare in standing and donned brief for ambulation.       Pertinent Vitals/Pain Pain Assessment: No/denies pain    Home Living                      Prior Function            PT Goals (current goals can now be found in the care plan section) Acute Rehab PT Goals Patient Stated Goal: to go home soon Time For Goal Achievement: 01/15/15 Progress towards PT goals: Progressing toward goals    Frequency  Min 3X/week    PT Plan Discharge plan needs to be updated;Frequency needs to be updated    Co-evaluation             End of Session Equipment Utilized During Treatment: Gait belt Activity Tolerance: Patient tolerated treatment well Patient left: in chair;with call bell/phone within reach;with chair alarm set;with family/visitor present  Time: 1131-1206 PT Time Calculation (min) (ACUTE ONLY): 35 min  Charges:  $Gait Training: 8-22 mins $Therapeutic Activity: 8-22 mins                    G Codes:      Breion Novacek 01/16/15, 1:07 PM Pager [phone]

## 2015-01-03 NOTE — Progress Notes (Signed)
  Echocardiogram Echocardiogram Transesophageal has been performed.  Delcie RochENNINGTON, Maeley Matton 01/03/2015, 10:43 AM

## 2015-01-03 NOTE — Clinical Social Work Note (Addendum)
Clinical Social Work Assessment  Patient Details  Name: Kelsey Copeland MRN: [id number] Date of Birth: August 03, 1945  Date of referral:  01/02/15               Reason for consult:  Facility Placement                Permission sought to share information with:  Family Supports, Oceanographeracility Contact Representative Permission granted to share information::  Yes, Verbal Permission Granted  Name::     Kelsey Copeland patient's daughter in Public relations account executivelaw  Agency::  SNF admissions  Relationship::     Contact Information:     Housing/Transportation Living arrangements for the past 2 months:  Single Family Home Source of Information:  Patient, Adult Children Patient Interpreter Needed:  None Criminal Activity/Legal Involvement Pertinent to Current Situation/Hospitalization:  No - Comment as needed Significant Relationships:  Adult Children Lives with:  Adult Children Do you feel safe going back to the place where you live?  Yes (Patient will need some short term rehab before she is ready to return back home.) Need for family participation in patient care:  Yes (Comment) (Patient has some dementia.)  Care giving concerns:  Patient and family feel she needs some short term rehab before she can return back home  Social Worker assessment / plan:  Patient is a 69 year old female who lives with her son, patient is alert and oriented x2.  Patient has some history of dementia and also has a past history of a stroke.  Patient's family was the main contact due to dementia, patient's family stated she has been in rehab in the past, but they did not have a good experience at the previous facility.  Patient's daughter in law is in agreement to going to a different facility for rehab.  CSW explained process of SNF search and what to expect.  Patient and family did not have any other questions about discharge planning.  Employment status:  Retired Health and safety inspectornsurance information:  Medicare PT Recommendations:  Skilled Nursing  Facility Information / Referral to community resources:     Patient/Family's Response to care:  Patient and family agreeable to going to SNF for short term rehab.  Patient/Family's Understanding of and Emotional Response to Diagnosis, Current Treatment, and Prognosis: Patient and family understand diagnosis and current treatment plan.  Emotional Assessment Appearance:  Appears stated age Attitude/Demeanor/Rapport:    Affect (typically observed):  Pleasant, Appropriate Orientation:  Oriented to Self, Oriented to Place, Oriented to Situation Alcohol / Substance use:  Not Applicable Psych involvement (Current and /or in the community):  No (Comment)  Discharge Needs  Concerns to be addressed:  No discharge needs identified Readmission within the last 30 days:  No Current discharge risk:  None Barriers to Discharge:  No Barriers Identified   [place] [employer], Kelsey Copeland, LCSWA 01/03/2015, 5:33 PM

## 2015-01-03 NOTE — Care Management Important Message (Signed)
Important Message  Patient Details  Name: Kelsey GreavesGail D Jarrett MRN: [id number] Date of Birth: 12/14/1945   Medicare Important Message Given:  Yes    Genessa Beman P Ginamarie Banfield 01/03/2015, 2:22 PM

## 2015-01-03 NOTE — Progress Notes (Signed)
SLP Cancellation Note  Patient Details Name: Kelsey Copeland MRN: [id number] DOB: [birth year]   Cancelled treatment:       Reason Eval/Treat Not Completed: Patient at procedure or test/unavailable   Donavan Burnetamara Ahmira Boisselle, MS [facility] SLP [phone]

## 2015-01-03 NOTE — CV Procedure (Signed)
     Transesophageal Echocardiogram Note  Jeronimo GreavesGail D Hsia [id number] 12-14-1945  Procedure: Transesophageal Echocardiogram Indications: Cryptogenic stroke  Procedure Details Consent: Obtained Time Out: Verified patient identification, verified procedure, site/side was marked, verified correct patient position, special equipment/implants available, Radiology Safety Procedures followed,  medications/allergies/relevent history reviewed, required imaging and test results available.  Performed  Medications: Fentanyl: 25 mcg Versed: 3 mg  No source of intracardiac thrombus was identified. No ASD/PFO, negative bubble study.  Complications: No apparent complications Patient did tolerate procedure well.  Lars MassonNELSON, Seanna Sisler H, MD, [facility] 01/03/2015, 8:41 AM

## 2015-01-03 NOTE — Discharge Summary (Signed)
PATIENT DETAILS Name: Kelsey Copeland Age: 69 y.o. Sex: female Date of Birth: 03/19/1945 MRN: [id number]. Admitting Physician: Lorretta Harp, MD WUJ:[id number] Ramon Dredge, MD  Admit Date: 12/31/2014 Discharge date: 01/03/2015  Recommendations for Outpatient Follow-up:  1. New medication-Keppra  2. Ensure follow-up with cardiology-loop recorder implantation, Neurology for post hospital discharge. 3. Please repeat CBC/BMET in 1 week   PRIMARY DISCHARGE DIAGNOSIS:  Principal Problem:   Acute encephalopathy Active Problems:   CVA (cerebral vascular accident) (HCC)   Essential hypertension   Arthritis of knee   HLD (hyperlipidemia)   GERD (gastroesophageal reflux disease)   CKD (chronic kidney disease), stage III   Stroke (cerebrum) (HCC)   Acute ischemic stroke (HCC)   Seizures (HCC)      PAST MEDICAL HISTORY: Past Medical History  Diagnosis Date  . Allergy   . Depression   . GERD (gastroesophageal reflux disease)   . Hyperlipidemia   . Hypertension   . Stroke (HCC)   . Arthritis   . COPD (chronic obstructive pulmonary disease) (HCC)   . CKD (chronic kidney disease), stage III     DISCHARGE MEDICATIONS: Current Discharge Medication List    START taking these medications   Details  levETIRAcetam (KEPPRA) 500 MG tablet Take 1 tablet (500 mg total) by mouth 2 (two) times daily.      CONTINUE these medications which have NOT CHANGED   Details  amLODipine (NORVASC) 10 MG tablet Take 10 mg by mouth daily.    aspirin 325 MG tablet Take 325 mg by mouth daily.    atorvastatin (LIPITOR) 80 MG tablet Take 80 mg by mouth daily.    ezetimibe (ZETIA) 10 MG tablet Take 10 mg by mouth daily.    folic acid (FOLVITE) 1 MG tablet Take 1 mg by mouth daily.    metoprolol tartrate (LOPRESSOR) 25 MG tablet Take 25 mg by mouth daily.     omeprazole (PRILOSEC) 40 MG capsule Take 40 mg by mouth daily.    sertraline (ZOLOFT) 100 MG tablet Take 200 mg by mouth daily.       traZODone (DESYREL) 50 MG tablet Take 50 mg by mouth at bedtime.        ALLERGIES:   Allergies  Allergen Reactions  . Plavix [Clopidogrel Bisulfate] Anaphylaxis    Bleeding also  . Amoxicillin Other (See Comments)  . Ciprofloxacin Other (See Comments)    Causes muscle aches.  . Dipyrone Other (See Comments)    headaches  . Acyclovir And Related Other (See Comments)  . Celebrex [Celecoxib] Other (See Comments)  . Penicillins Rash    BRIEF HPI:  See H&P, Labs, Consult and Test reports for all details in brief, patient was admitted for evaluation of confusion.   CONSULTATIONS:   cardiology and neurology  PERTINENT RADIOLOGIC STUDIES: Ct Head Wo Contrast  12/31/2014  CLINICAL DATA:  69 year old female with difficulty speaking and walking presenting with confusion and slurred speech. EXAM: CT HEAD WITHOUT CONTRAST TECHNIQUE: Contiguous axial images were obtained from the base of the skull through the vertex without intravenous contrast. COMPARISON:  None. FINDINGS: There is dilatation of the ventricles out of proportion with the sulci which may represent central volume loss versus normal pressure hydrocephalus. Clinical correlation is recommended. Periventricular and deep white matter hypodensities represent chronic microvascular ischemic changes. Small left frontal lobe cortical/subcortical old-appearing infarct and encephalomalacia. There is no intracranial hemorrhage. No mass effect or midline shift identified. The visualized paranasal sinuses and mastoid air cells are well aerated. The  calvarium is intact. IMPRESSION: No acute intracranial hemorrhage. 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 If symptoms persist and there are no contraindications, MRI may provide better evaluation if clinically indicated. These results were called by telephone at the time of interpretation on 12/31/2014 at 10:40 pm to Dr. Eber HongBRIAN MILLER , who verbally  acknowledged these results. Electronically Signed   By: Elgie CollardArash  Radparvar M.D.   On: 12/31/2014 22:47   Mr Brain Wo Contrast  01/01/2015  CLINICAL DATA:  Aphasia. Evaluate stroke. History of chronic kidney disease, hyperlipidemia and hypertension. EXAM: MRI HEAD WITHOUT CONTRAST MRA HEAD WITHOUT CONTRAST TECHNIQUE: Multiplanar, multiecho pulse sequences of the brain and surrounding structures were obtained without intravenous contrast. Angiographic images of the head were obtained using MRA technique without contrast. COMPARISON:  CT head December 31, 2014 FINDINGS: MRI HEAD FINDINGS Multiple sequences are mildly or moderately motion degraded. Subcentimeter focus of reduced diffusion LEFT occipital lobe cortex. Due to small size, limited assessment on ADC map. Moderate ventriculomegaly on the basis of global parenchymal brain volume loss. Small area LEFT frontal encephalomalacia. Patchy supratentorial and confluent pontine white matter T2 hyperintensities without midline shift, mass effect or mass lesions. No abnormal extra-axial fluid collections. Dural calcifications along anterior falx. Ocular globes and orbital contents are unremarkable though not tailored for evaluation. No abnormal sellar expansion. No suspicious calvarial bone marrow signal. Craniocervical junction maintained. Visualized paranasal sinuses and mastoid air cells are well-aerated. MRA HEAD FINDINGS Moderately motion degraded examination. Anterior circulation: Normal flow related enhancement of the included cervical, petrous, cavernous and supraclinoid internal carotid arteries. Patent anterior communicating artery. Normal flow related enhancement of the anterior and middle cerebral arteries, including distal segments. LEFT A1 segment is developmentally diminutive. Supernumerary anterior cerebral artery arising from from A-comm artery. No large vessel occlusion, high-grade stenosis, definite aneurysm. Posterior circulation: Codominant  vertebral arteries. Basilar artery is patent, with normal flow related enhancement of the main branch vessels. Normal flow related enhancement of the posterior cerebral arteries. Fetal origin LEFT posterior cerebral artery. No large vessel occlusion, high-grade stenosis, definite aneurysm. IMPRESSION: MRI HEAD: Motion degraded examination. Subcentimeter focus of probable acute ischemia LEFT occipital lobe. Moderate to severe chronic small vessel ischemic disease. Moderate global brain atrophy. LEFT frontal lobe encephalomalacia may be posttraumatic or ischemic. MRA HEAD: Moderately motion degraded examination without acute large vessel occlusion or definite high-grade stenosis. Electronically Signed   By: Awilda Metroourtnay  Bloomer M.D.   On: 01/01/2015 01:55   Mr Maxine GlennMra Head/brain Wo Cm  01/01/2015  CLINICAL DATA:  Aphasia. Evaluate stroke. History of chronic kidney disease, hyperlipidemia and hypertension. EXAM: MRI HEAD WITHOUT CONTRAST MRA HEAD WITHOUT CONTRAST TECHNIQUE: Multiplanar, multiecho pulse sequences of the brain and surrounding structures were obtained without intravenous contrast. Angiographic images of the head were obtained using MRA technique without contrast. COMPARISON:  CT head December 31, 2014 FINDINGS: MRI HEAD FINDINGS Multiple sequences are mildly or moderately motion degraded. Subcentimeter focus of reduced diffusion LEFT occipital lobe cortex. Due to small size, limited assessment on ADC map. Moderate ventriculomegaly on the basis of global parenchymal brain volume loss. Small area LEFT frontal encephalomalacia. Patchy supratentorial and confluent pontine white matter T2 hyperintensities without midline shift, mass effect or mass lesions. No abnormal extra-axial fluid collections. Dural calcifications along anterior falx. Ocular globes and orbital contents are unremarkable though not tailored for evaluation. No abnormal sellar expansion. No suspicious calvarial bone marrow signal. Craniocervical  junction maintained. Visualized paranasal sinuses and mastoid air cells are well-aerated.  MRA HEAD FINDINGS Moderately motion degraded examination. Anterior circulation: Normal flow related enhancement of the included cervical, petrous, cavernous and supraclinoid internal carotid arteries. Patent anterior communicating artery. Normal flow related enhancement of the anterior and middle cerebral arteries, including distal segments. LEFT A1 segment is developmentally diminutive. Supernumerary anterior cerebral artery arising from from A-comm artery. No large vessel occlusion, high-grade stenosis, definite aneurysm. Posterior circulation: Codominant vertebral arteries. Basilar artery is patent, with normal flow related enhancement of the main branch vessels. Normal flow related enhancement of the posterior cerebral arteries. Fetal origin LEFT posterior cerebral artery. No large vessel occlusion, high-grade stenosis, definite aneurysm. IMPRESSION: MRI HEAD: Motion degraded examination. Subcentimeter focus of probable acute ischemia LEFT occipital lobe. Moderate to severe chronic small vessel ischemic disease. Moderate global brain atrophy. LEFT frontal lobe encephalomalacia may be posttraumatic or ischemic. MRA HEAD: Moderately motion degraded examination without acute large vessel occlusion or definite high-grade stenosis. Electronically Signed   By: Awilda Metro M.D.   On: 01/01/2015 01:55     PERTINENT LAB RESULTS: CBC:  Recent Labs  12/31/14 2205 12/31/14 2212  WBC  --  7.3  HGB 11.6* 11.1*  HCT 34.0* 33.4*  PLT  --  219   CMET CMP     Component Value Date/Time   NA 142 12/31/2014 2212   K 3.9 12/31/2014 2212   CL 110 12/31/2014 2212   CO2 20* 12/31/2014 2212   GLUCOSE 100* 12/31/2014 2212   BUN 13 12/31/2014 2212   CREATININE 1.46* 12/31/2014 2212   CALCIUM 9.4 12/31/2014 2212   PROT 6.4* 12/31/2014 2212   ALBUMIN 3.5 12/31/2014 2212   AST 30 12/31/2014 2212   ALT 21 12/31/2014  2212   ALKPHOS 81 12/31/2014 2212   BILITOT 0.6 12/31/2014 2212   GFRNONAA 36* 12/31/2014 2212   GFRAA 41* 12/31/2014 2212    GFR Estimated Creatinine Clearance: 31.3 mL/min (by C-G formula based on Cr of 1.46). No results for input(s): LIPASE, AMYLASE in the last 72 hours. No results for input(s): CKTOTAL, CKMB, CKMBINDEX, TROPONINI in the last 72 hours. Invalid input(s): POCBNP No results for input(s): DDIMER in the last 72 hours.  Recent Labs  01/01/15 0531  HGBA1C 5.8*    Recent Labs  01/01/15 0531  CHOL 106  HDL 34*  LDLCALC 51  TRIG 161  CHOLHDL 3.1   No results for input(s): TSH, T4TOTAL, T3FREE, THYROIDAB in the last 72 hours.  Invalid input(s): FREET3 No results for input(s): VITAMINB12, FOLATE, FERRITIN, TIBC, IRON, RETICCTPCT in the last 72 hours. Coags:  Recent Labs  12/31/14 2212  INR 1.06   Microbiology: No results found for this or any previous visit (from the past 240 hour(s)).   BRIEF HOSPITAL COURSE:  Acute encephalopathy: resolved, MRI brain shows a possible small infarct in the left occipital lobe-not sure that this can account for her presenting symptoms. No EEG evidence of seizures. 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  Active Problems: Acute CVA: Recent CVA in October 2015-small CVA seen in the left occipital lobe on MRI this admission. Continue aspirin and statin. Neurology consulted, recommendations were for TEE/loop recorder. TEE on 12/13 was negative for any embolic source, currently awaiting loop recorder implantation-will be discharged to a skilled nursing facility following this. Note-patient had a recent CTA neck at [facility] which showed 50% distal CCA and 50% proximal ICA stenosis bilaterally-hence this was not repeated during this hospitalization. Patient will need outpatient  follow-up with cardiology and neurology.   Essential hypertension: Cautiously  continue with amlodipine and metoprolol  Dyslipidemia: Continue statin and Zetia. LDL 51  GERD: Continue PPI  Chronic kidney disease stage III: Creatinine closely usual baseline. Follow electrolytes periodically  Depression: Appears stable-continue Zoloft and trazodone  TODAY-DAY OF DISCHARGE:  Subjective:   Kelsey Copeland today has no headache,no chest abdominal pain,no new weakness tingling or numbness, feels much better   Objective:   Blood pressure 140/65, pulse 92, temperature 98.2 F (36.8 C), temperature source Oral, resp. rate 21, height 5' (1.524 m), weight 68.221 kg (150 lb 6.4 oz), SpO2 92 %. No intake or output data in the 24 hours ending 01/03/15 1141 Filed Weights   12/31/14 2150 01/01/15 0143  Weight: 72.576 kg (160 lb) 68.221 kg (150 lb 6.4 oz)    Exam Awake Alert, Oriented *3, No new F.N deficits, Normal affect [place].AT,PERRAL Supple Neck,No JVD, No cervical lymphadenopathy appriciated.  Symmetrical Chest wall movement, Good air movement bilaterally, CTAB RRR,No Gallops,Rubs or new Murmurs, No Parasternal Heave +ve B.Sounds, Abd Soft, Non tender, No organomegaly appriciated, No rebound -guarding or rigidity. No Cyanosis, Clubbing or edema, No new Rash or bruise  DISCHARGE CONDITION: Stable  DISPOSITION: SNF  DISCHARGE INSTRUCTIONS:    Activity:  As tolerated with Full fall precautions use walker/cane & assistance as needed  Get Medicines reviewed and adjusted: Please take all your medications with you for your next visit with your Primary MD  Please request your Primary MD to go over all hospital tests and procedure/radiological results at the follow up, please ask your Primary MD to get all Hospital records sent to his/her office.  If you experience worsening of your admission symptoms, develop shortness of breath, life threatening emergency, suicidal or homicidal thoughts you must seek medical attention immediately by calling 911 or calling your MD  immediately  if symptoms less severe.  You must read complete instructions/literature along with all the possible adverse reactions/side effects for all the Medicines you take and that have been prescribed to you. Take any new Medicines after you have completely understood and accpet all the possible adverse reactions/side effects.   Do not drive when taking Pain medications.   Do not take more than prescribed Pain, Sleep and Anxiety Medications  Special Instructions: If you have smoked or chewed Tobacco  in the last 2 yrs please stop smoking, stop any regular Alcohol  and or any Recreational drug use.  Wear Seat belts while driving.  Please note  You were cared for by a hospitalist during your hospital stay. Once you are discharged, your primary care physician will handle any further medical issues. Please note that NO REFILLS for any discharge medications will be authorized once you are discharged, as it is imperative that you return to your primary care physician (or establish a relationship with a primary care physician if you do not have one) for your aftercare needs so that they can reassess your need for medications and monitor your lab values.   Diet recommendation: Diabetic Diet Heart Healthy diet  Discharge Instructions    Ambulatory referral to Neurology    Complete by:  As directed      Diet - low sodium heart healthy    Complete by:  As directed      Increase activity slowly    Complete by:  As directed            Follow-up Information    Follow up with [facility]   Specialty:  Skilled Nursing Facility   Contact information:   [address] [place] [address] [phone]      Follow up with Abigail Miyamoto, MD. Schedule an appointment as soon as possible for a visit in 1 week.   Specialty:  Family Medicine   Why:  Hospital follow up   Contact information:   [address] [place] [address] [phone]        Follow up with Xu,Jindong, MD. Schedule an appointment as soon as possible for a visit in 2 months.   Specialty:  Neurology   Why:  Hospital follow up-office will call   Contact information:   [address] [place] [address] [phone]      Total Time spent on discharge equals  45 minutes.  SignedJeoffrey Massed 01/03/2015 11:41 AM

## 2015-01-03 NOTE — Interval H&P Note (Signed)
History and Physical Interval Note:  01/03/2015 8:40 AM  Jeronimo GreavesGail D Gartrell  has presented today for surgery, with the diagnosis of STROKE  The various methods of treatment have been discussed with the patient and family. After consideration of risks, benefits and other options for treatment, the patient has consented to  Procedure(s) with comments: TRANSESOPHAGEAL ECHOCARDIOGRAM (TEE) (N/A) - NEED LOOP as a surgical intervention .  The patient's history has been reviewed, patient examined, no change in status, stable for surgery.  I have reviewed the patient's chart and labs.  Questions were answered to the patient's satisfaction.     Lars MassonNELSON, Gracen Southwell H

## 2015-01-05 ENCOUNTER — Other Ambulatory Visit: Payer: Self-pay | Admitting: Neurology

## 2015-01-05 DIAGNOSIS — I63432 Cerebral infarction due to embolism of left posterior cerebral artery: Secondary | ICD-10-CM | POA: Insufficient documentation

## 2015-01-05 DIAGNOSIS — I639 Cerebral infarction, unspecified: Secondary | ICD-10-CM

## 2015-01-05 NOTE — Progress Notes (Signed)
STROKE TEAM PROGRESS NOTE   SUBJECTIVE (INTERVAL HISTORY) No family is at the bedside.  Had TEE and no source of emboli seen. Loop was placed.    CBC:   Recent Labs Lab 12/31/14 2205 12/31/14 2212  WBC  --  7.3  NEUTROABS  --  5.5  HGB 11.6* 11.1*  HCT 34.0* 33.4*  MCV  --  87.9  PLT  --  219    Basic Metabolic Panel:   Recent Labs Lab 12/31/14 2205 12/31/14 2212  NA 142 142  K 3.8 3.9  CL 111 110  CO2  --  20*  GLUCOSE 95 100*  BUN 14 13  CREATININE 1.20* 1.46*  CALCIUM  --  9.4    Lipid Panel:     Component Value Date/Time   CHOL 106 01/01/2015 0531   TRIG 104 01/01/2015 0531   HDL 34* 01/01/2015 0531   CHOLHDL 3.1 01/01/2015 0531   VLDL 21 01/01/2015 0531   LDLCALC 51 01/01/2015 0531   HgbA1c:  Lab Results  Component Value Date   HGBA1C 5.8* 01/01/2015   Urine Drug Screen:     Component Value Date/Time   LABOPIA NONE DETECTED 01/02/2015 0615   COCAINSCRNUR NONE DETECTED 01/02/2015 0615   LABBENZ NONE DETECTED 01/02/2015 0615   AMPHETMU NONE DETECTED 01/02/2015 0615   THCU NONE DETECTED 01/02/2015 0615   LABBARB NONE DETECTED 01/02/2015 0615      IMAGING I have personally reviewed the radiological images below and agree with the radiology interpretations.  Ct Head Wo Contrast 12/31/2014  No acute intracranial hemorrhage. 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   MRI HEAD 01/01/2015 : Motion degraded examination. Subcentimeter focus of probable acute ischemia LEFT occipital lobe. Moderate to severe chronic small vessel ischemic disease. Moderate global brain atrophy. LEFT frontal lobe encephalomalacia may be posttraumatic or ischemic.   MRA HEAD 01/01/2015 : Moderately motion degraded examination without acute large vessel occlusion or definite high-grade stenosis.   CTA neck 10/10/14 Prominent calcified plaque in the cervical carotid arteries bilaterally resulting in 50% distal common  carotid artery and 50% proximal internal carotid artery stenosis bilaterally.  MRI brain 10/07/14 1. Prominent anterior division left MCA territory infarct without evidence for hemorrhage. T2 changes suggest a subacute time frame. 2. Extensive remote small vessel disease is present as well. 3. Minimal left maxillary sinus disease.  Carotid Doppler   A moderate amount of calcified plaque in both distal CCA extending into the ICA bilaterally. Right ICA 1-39% stenosis and left ICA 40-59% stenosis. Vertebral flow demonstrates antegrade flow.  EEG this is an abnormal awake and asleep EEG because of the above described findings. The presence of intermittent theta slowing over the left hemisphere may suggest focal cerebral dysfunction in that region. Left FIRDA could be indicative of focal structural lesion over the left frontal region or be part of an encephalopathic non specific process. No electrographic seizures noted. Clinical correlation is advised.   2D echo - - Left ventricle: The cavity size was normal. Systolic function was normal. The estimated ejection fraction was in the range of 60% to 65%. Wall motion was normal; there were no regional wall motion abnormalities. Impressions: - No cardiac source of emboli was indentified.  LE venous doppler - negative for DVT  TEE - No source of intracardiac thrombus was identified. No ASD/PFO, negative bubble study.  PHYSICAL EXAM  General - Well nourished, well developed, in no apparent distress.  Ophthalmologic - Fundi not  visualized due to eye movement.  Cardiovascular - Regular rate and rhythm with no murmur.  Mental Status -  Level of arousal and orientation to year, place, and person were intact, but not orientated to month or age. Language exam showed mild  Hesitancy of speech, with occasional paraphasic errors, naming 2/4, able to repeat but slow and dysarthria.  Cranial Nerves II - XII - II - Visual field intact OU. III,  IV, VI - Extraocular movements intact. V - Facial sensation decreased on the right. VII - right facial droop. VIII - Hearing & vestibular intact bilaterally. X - Palate elevates symmetrically, mild to moderate dysarthria. XI - Chin turning & shoulder shrug intact bilaterally. XII - Tongue protrusion toward right.  Motor Strength - The patient's strength was RUE 4/5, RLE 5-/5, LUE and LLE 5/5.  Bulk was normal and fasciculations were absent.   Motor Tone - Muscle tone was assessed at the neck and appendages and was normal.  Reflexes - The patient's reflexes were 1+ in all extremities and she had no pathological reflexes.  Sensory - Light touch, temperature/pinprick were assessed and were symmetrical.    Coordination - The patient had normal movements in the hands with no ataxia or dysmetria.  Tremor was absent.  Gait and Station - not tested as pt prefers to have lunch.   ASSESSMENT/PLAN Ms. RASHAWN ROLON is a 70 y.o. female with history of stroke in  Oct presenting with worsening aphasia and confusion. She did not receive IV t-PA.  Stroke: acute left occipital punctate infarct and recent left MCA cortical infarct, embolic secondary to unknown source.  MRI  Acute L occipital punctate infarct   MRA  No large vessel stenosis, fetal PCA on the left  Recent CTA neck at [facility] - 50% distal CCA and 50% proximal ICA stenosis bilaterally.  Recent 2D at [facility] No intracardiac source of embolus identified.   Carotid doppler L 40-59% stenosis  2D Echo unremarkable   TEE negative for source of stroke.   Loop recorder placed to rule out afib/aflutter.  LDL 51  HgbA1c 5.8  Heparin 5000 units sq tid for VTE prophylaxis Diet - low sodium heart healthy  aspirin 325 mg daily prior to admission, now on aspirin 325 mg daily. Continue ASA at discharge.  Patient counseled to be compliant with her antithrombotic medications  Ongoing aggressive stroke risk factor management.  Therapy  recommendations:  SNF   Disposition:  pending   Possible seizure  Presentation concerning for seizure and post ictal state  Started on keppra  EEG negative for seizure but left frontal FIRDA - may be postictal state  Continue keppra on discharge  Hypertension  Stable Permissive hypertension (OK if < 220/120) but gradually normalize in 5-7 days  Hyperlipidemia  Home meds:  lipitor 80, resumed in hospital  LDL 51, goal < 70  Continue statin at discharge  Other Stroke Risk Factors  Advanced age  Hx stroke/TIA  10/2014 ([facility]) anterior L MCA territory infarct, embolic.   08/2014 several small L hemispheric infarcts, fluctuating speech.  02/2014 TIA with R arm weakness 1-2 h.  Aspirin added to aggrenox. Off aggrenox d/t severe HA  2014 - similar episode to 10/2014. Seen in ED but not admitted. L ICA stenosis found. Had been on aspirin and plavix, stopped due to ulcers  Pt is a candidate for RESPECT ESUS trial   Other Active Problems  GERD  Chronic kidney disease stage III  depression  Hospital day #  3  Neurology will sign off. Please call with questions. Pt will follow up with Dr. Roda Shutters at [facility] in about 2 months. Thanks for the consult.   Marvel Plan, MD PhD Stroke Neurology 01/05/2015 6:30 AM     To contact Stroke Continuity provider, please refer to [email or web address]. After hours, contact General Neurology

## 2015-01-07 ENCOUNTER — Encounter (HOSPITAL_COMMUNITY): Payer: Self-pay | Admitting: Cardiology

## 2015-01-21 ENCOUNTER — Encounter: Payer: Self-pay | Admitting: Internal Medicine

## 2015-01-29 ENCOUNTER — Telehealth: Payer: Self-pay | Admitting: Cardiology

## 2015-01-29 NOTE — Telephone Encounter (Signed)
Camila Li, patient's nurse at [facility], at [facility] request.  Lesle Reek states that she is unaware of patient having her loop recorder interrogated since being admitted on 01/23/15.  She requests that I call her back tomorrow so that she can look further into the patient's chart.  Eloise Levels that I will call her back, also gave device clinic phone number.

## 2015-01-29 NOTE — Telephone Encounter (Signed)
Spoke w/ pt daughter in law and she informed me that pt was currently in the hospital b/c she had another stroke.

## 2015-01-29 NOTE — Telephone Encounter (Addendum)
Spoke with patient's daughter-in-law, Victoire Deans, who states that patient was admitted to [facility] hospital with a stroke.  The [facility] monitor is at the patient's ALF, so she is unable to send a manual transmission.  Dois Davenport is unsure if patient's Lauretta Grill has been interrogated since she was admitted.  Dois Davenport gave the phone number for [facility], patient's nurse tonight, [phone].  Dois Davenport is appreciative of updates and denies additional questions or concerns at this time.

## 2015-01-31 NOTE — Telephone Encounter (Signed)
Attempted to reach [place], but was able to reach [place], Charity fundraiser.  She states that patient discharged back to ALF.  Will attempt to reach patient's family to follow-up on sending a manual transmission.

## 2015-01-31 NOTE — Telephone Encounter (Signed)
Spoke with Perley Jain, who advised that I call patient's ALF.  Phone number is [phone].  Advised that I will call and request a manual transmission.  Dois Davenport verbalizes understanding and denies any additional questions at this time.

## 2015-02-03 ENCOUNTER — Ambulatory Visit (INDEPENDENT_AMBULATORY_CARE_PROVIDER_SITE_OTHER): Payer: Commercial Managed Care - HMO | Admitting: *Deleted

## 2015-02-03 DIAGNOSIS — I639 Cerebral infarction, unspecified: Secondary | ICD-10-CM

## 2015-02-04 NOTE — Telephone Encounter (Signed)
Spoke with multiple med techs at patient's ALF that state they are unaware of patient having a monitor.  Spoke with Rosey Bath, Resident Care Coordinator, who advised that no one was aware that patient had a Carelink monitor.  Judy Pimple that the monitor most recently transmitted on 02/03/15, so it is likely plugged in near her bed.  Rosey Bath was able to find monitor and successfully sent a manual transmission.  She denies additional questions or concerns at this time.  Tachy episodes printed for review by Dr. Johney Frame.

## 2015-02-04 NOTE — Progress Notes (Signed)
Carelink Summary Report / Loop Recorder 

## 2015-02-28 ENCOUNTER — Encounter: Payer: Self-pay | Admitting: Cardiology

## 2015-03-04 ENCOUNTER — Ambulatory Visit: Payer: Commercial Managed Care - HMO | Admitting: *Deleted

## 2015-03-04 DIAGNOSIS — I639 Cerebral infarction, unspecified: Secondary | ICD-10-CM

## 2015-03-04 NOTE — Progress Notes (Signed)
Carelink Summary Report / Loop Recorder 

## 2015-03-05 DEATH — deceased

## 2015-03-07 ENCOUNTER — Encounter: Payer: Self-pay | Admitting: Cardiology

## 2015-03-21 ENCOUNTER — Telehealth: Payer: Self-pay | Admitting: Cardiology

## 2015-03-21 NOTE — Telephone Encounter (Signed)
Attempted to call pt in regards to home monitor for loop recorder. No answer and unable to leave a message.

## 2015-03-25 ENCOUNTER — Ambulatory Visit: Payer: Commercial Managed Care - HMO | Admitting: Neurology

## 2015-04-15 LAB — CUP PACEART REMOTE DEVICE CHECK: MDC IDC SESS DTM: 20170129170548

## 2015-05-27 LAB — CUP PACEART REMOTE DEVICE CHECK: MDC IDC SESS DTM: 20170228173521

## 2016-11-28 IMAGING — CT CT HEAD W/O CM
2 series · 15 of 30 positions shown, 17 images · non-contrast
Comparison: None.

CLINICAL DATA: 69-year-old female with difficulty speaking and
walking presenting with confusion and slurred speech.

EXAM:
CT HEAD WITHOUT CONTRAST
TECHNIQUE: Contiguous axial images were obtained from the base of the skull
through the vertex without intravenous contrast.

[Series 2: head without · axial · non-contrast · 0.42mm/px · z∈[-139,-19]mm · 7 of 32 slices shown, 9 images]
[im 4/32  brain]
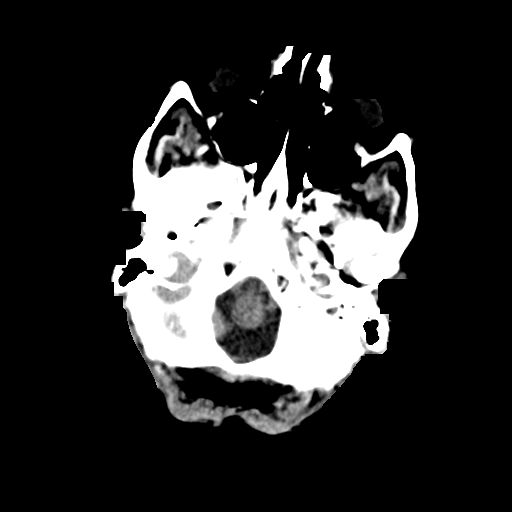
[im 4/32  bone]
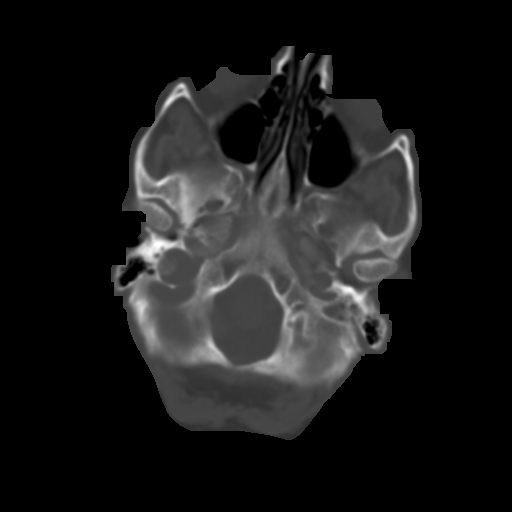
[im 8/32  brain]
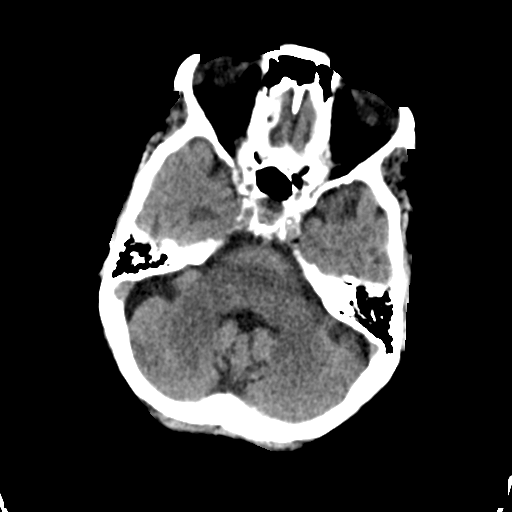
[im 12/32  brain]
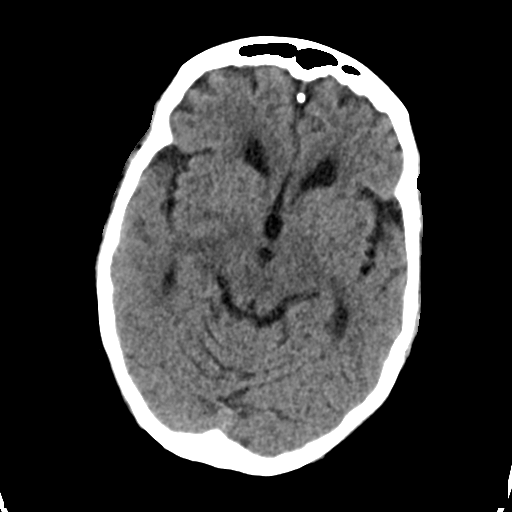
[im 16/32  brain]
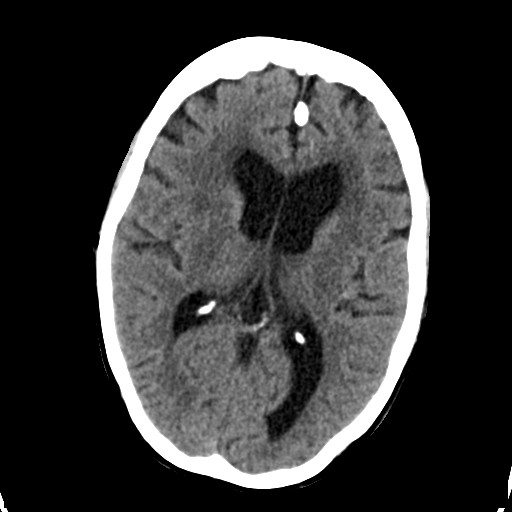
[im 20/32  brain]
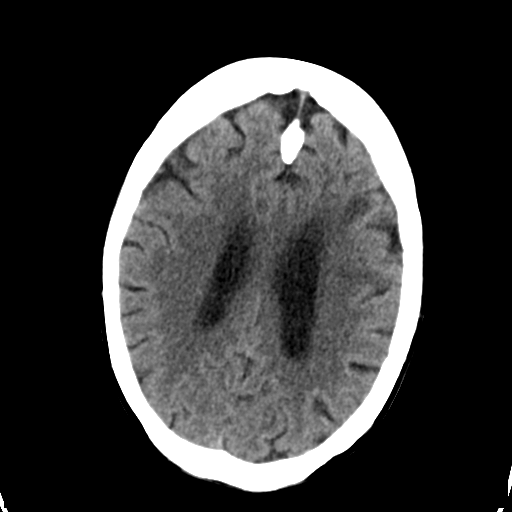
[im 20/32  bone]
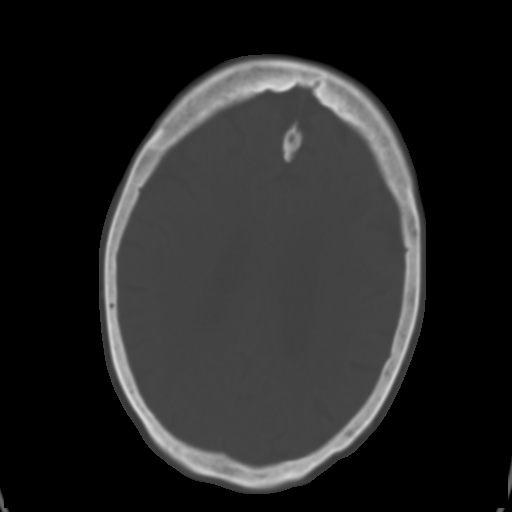
[im 24/32  brain]
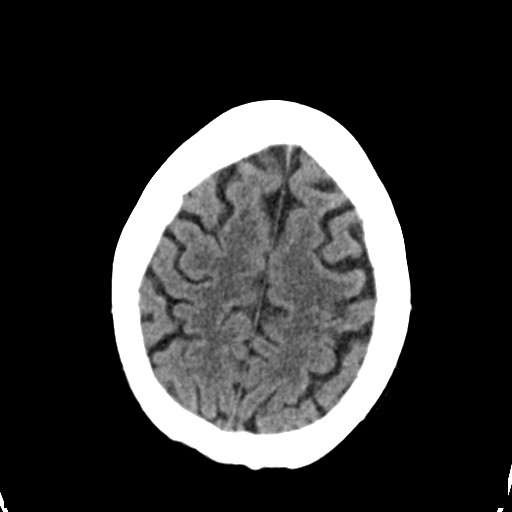
[im 28/32  brain]
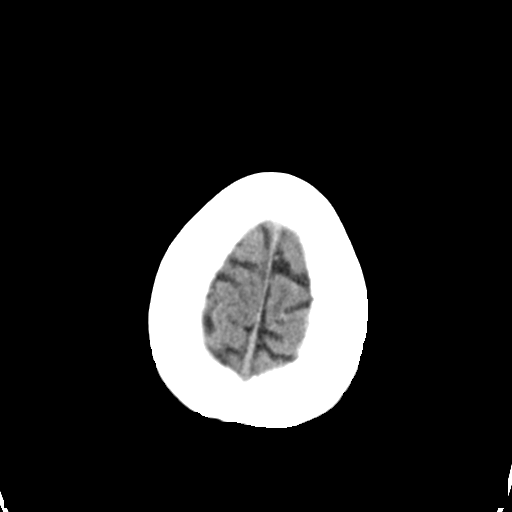

[Series 3: head bone · axial · 0.42mm/px · z∈[-140,-14]mm · 8 of 79 slices shown]
[im 8/79  bone]
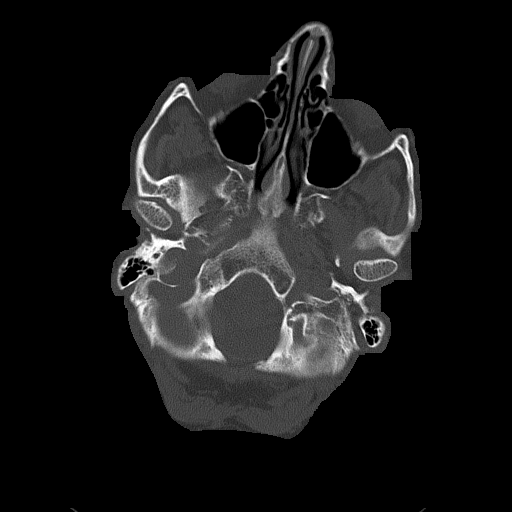
[im 16/79  bone]
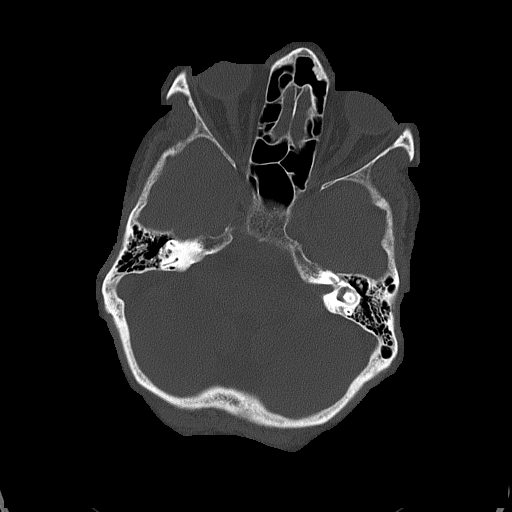
[im 24/79  bone]
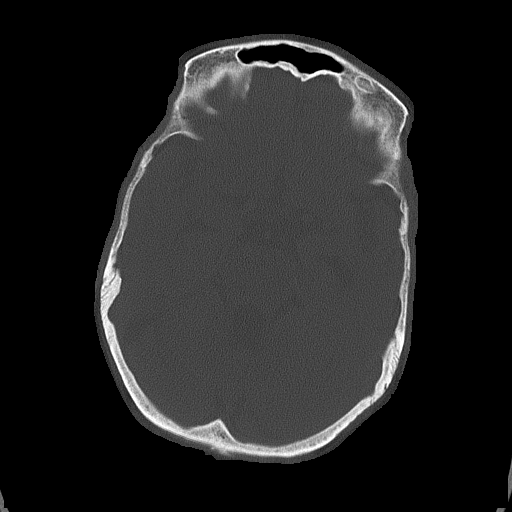
[im 36/79  bone]
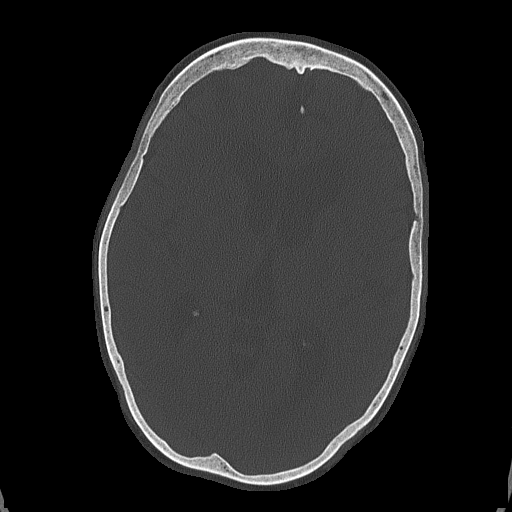
[im 43/79  bone]
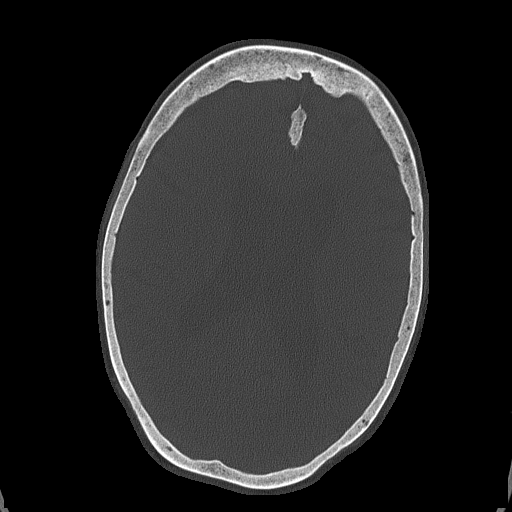
[im 55/79  bone]
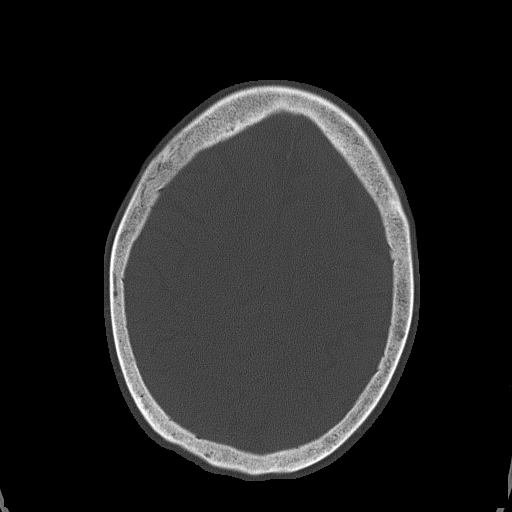
[im 63/79  bone]
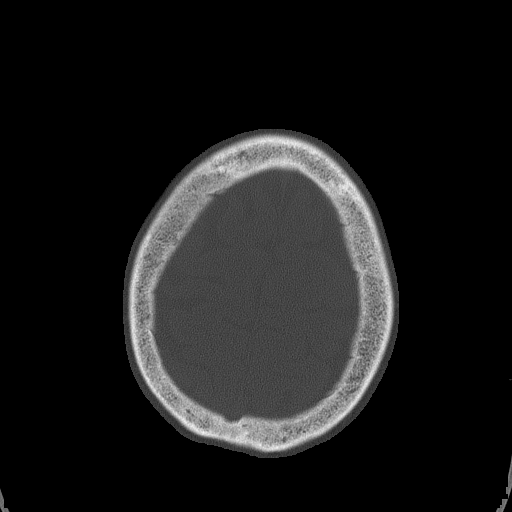
[im 71/79  bone]
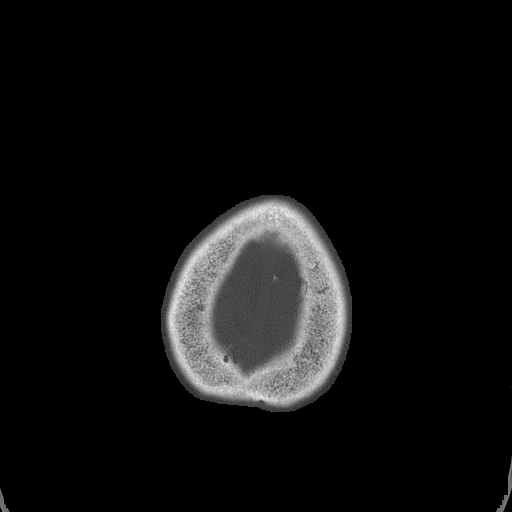

[15 of 30 positions shown; findings below may reference images not displayed]

FINDINGS: There is dilatation of the ventricles out of proportion with the
sulci which may represent central volume loss versus normal pressure
hydrocephalus. Clinical correlation is recommended. Periventricular
and deep white matter hypodensities represent chronic microvascular
ischemic changes. Small left frontal lobe cortical/subcortical
old-appearing infarct and encephalomalacia. There is no intracranial
hemorrhage. No mass effect or midline shift identified.

The visualized paranasal sinuses and mastoid air cells are well
aerated. The calvarium is intact.
IMPRESSION: No acute intracranial hemorrhage.

Age-related atrophy and chronic microvascular ischemic disease.
Small old left frontal infarct.

If symptoms persist and there are no contraindications, MRI may
provide better evaluation if clinically indicated.

These results were called by telephone at the time of interpretation
on 12/31/2014 at [DATE] to Dr. CHIOU LALATA , who verbally
acknowledged these results.
